# Patient Record
Sex: Female | Born: 2000 | Race: Black or African American | Hispanic: No | Marital: Single | Smoking: Current every day smoker
Health system: Southern US, Community
[De-identification: ages and names within clinical notes are randomized; demographics above are authoritative.]

## PROBLEM LIST (undated history)

## (undated) DIAGNOSIS — D571 Sickle-cell disease without crisis: Secondary | ICD-10-CM

---

## 2019-10-24 ENCOUNTER — Other Ambulatory Visit: Payer: Self-pay

## 2019-10-24 ENCOUNTER — Emergency Department (HOSPITAL_COMMUNITY)
Admission: EM | Admit: 2019-10-24 | Discharge: 2019-10-25 | Disposition: A | Discharge status: Home / Self Care | Payer: Federal, State, Local not specified - PPO | Attending: Emergency Medicine | Admitting: Emergency Medicine

## 2019-10-24 DIAGNOSIS — D5700 Hb-SS disease with crisis, unspecified: Secondary | ICD-10-CM | POA: Diagnosis present

## 2019-10-24 DIAGNOSIS — Z5321 Procedure and treatment not carried out due to patient leaving prior to being seen by health care provider: Secondary | ICD-10-CM | POA: Diagnosis not present

## 2019-10-24 LAB — COMPREHENSIVE METABOLIC PANEL
ALT: 23 U/L (ref 0–44)
AST: 38 U/L (ref 15–41)
Albumin: 3.9 g/dL (ref 3.5–5.0)
Alkaline Phosphatase: 112 U/L (ref 38–126)
Anion gap: 7 (ref 5–15)
BUN: 5 mg/dL — ABNORMAL LOW (ref 6–20)
CO2: 25 mmol/L (ref 22–32)
Calcium: 9 mg/dL (ref 8.9–10.3)
Chloride: 107 mmol/L (ref 98–111)
Creatinine, Ser: 0.34 mg/dL — ABNORMAL LOW (ref 0.44–1.00)
GFR calc Af Amer: >60 mL/min (ref >60)
GFR calc non Af Amer: >60 mL/min (ref >60)
Glucose, Bld: 92 mg/dL (ref 70–99)
Potassium: 3.8 mmol/L (ref 3.5–5.1)
Sodium: 139 mmol/L (ref 135–145)
Total Bilirubin: 3.2 mg/dL — ABNORMAL HIGH (ref 0.3–1.2)
Total Protein: 6.4 g/dL — ABNORMAL LOW (ref 6.5–8.1)

## 2019-10-24 LAB — CBC WITH DIFFERENTIAL/PLATELET
Abs Immature Granulocytes: 0.09 10*3/uL — ABNORMAL HIGH (ref 0.00–0.07)
Basophils Absolute: 0.1 10*3/uL (ref 0.0–0.1)
Basophils Relative: 1 %
Eosinophils Absolute: 0.6 10*3/uL — ABNORMAL HIGH (ref 0.0–0.5)
Eosinophils Relative: 5 %
HCT: 21 % — ABNORMAL LOW (ref 36.0–46.0)
Hemoglobin: 7.5 g/dL — ABNORMAL LOW (ref 12.0–15.0)
Immature Granulocytes: 1 %
Lymphocytes Relative: 43 %
Lymphs Abs: 5.2 10*3/uL — ABNORMAL HIGH (ref 0.7–4.0)
MCH: 32.2 pg (ref 26.0–34.0)
MCHC: 35.7 g/dL (ref 30.0–36.0)
MCV: 90.1 fL (ref 80.0–100.0)
Monocytes Absolute: 0.9 10*3/uL (ref 0.1–1.0)
Monocytes Relative: 7 %
Neutro Abs: 5.2 10*3/uL (ref 1.7–7.7)
Neutrophils Relative %: 43 %
Platelets: 291 10*3/uL (ref 150–400)
RBC: 2.33 MIL/uL — ABNORMAL LOW (ref 3.87–5.11)
RDW: 22 % — ABNORMAL HIGH (ref 11.5–15.5)
WBC: 12.1 10*3/uL — ABNORMAL HIGH (ref 4.0–10.5)
nRBC: 3.1 % — ABNORMAL HIGH (ref 0.0–0.2)

## 2019-10-24 LAB — RETICULOCYTES
Immature Retic Fract: 31.1 % — ABNORMAL HIGH (ref 2.3–15.9)
RBC.: 2.35 MIL/uL — ABNORMAL LOW (ref 3.87–5.11)
Retic Count, Absolute: 241.6 10*3/uL — ABNORMAL HIGH (ref 19.0–186.0)
Retic Ct Pct: 10.3 % — ABNORMAL HIGH (ref 0.4–3.1)

## 2019-10-24 LAB — I-STAT BETA HCG BLOOD, ED (MC, WL, AP ONLY): I-stat hCG, quantitative: <5 m[IU]/mL (ref <5)

## 2019-10-24 MED ORDER — ONDANSETRON 4 MG PO TBDP
4.0000 mg | ORAL_TABLET | Freq: Once | ORAL | Status: AC
  Administered 2019-10-24: 4 mg via ORAL
  Filled 2019-10-24: qty 1

## 2019-10-24 MED ORDER — HYDROMORPHONE HCL 1 MG/ML IJ SOLN
0.5000 mg | Freq: Once | INTRAMUSCULAR | Status: AC
  Administered 2019-10-24: 0.5 mg via SUBCUTANEOUS
  Filled 2019-10-24: qty 1

## 2019-10-24 NOTE — ED Triage Notes (Signed)
Pt presents to ED POV. Pt c/o sickle cell crisis. Pt reports that she is having pain in lower back and knees. NAD

## 2019-10-25 ENCOUNTER — Telehealth: Payer: Self-pay | Admitting: Nurse Practitioner

## 2019-10-25 MED ORDER — HYDROMORPHONE HCL 1 MG/ML IJ SOLN
0.5000 mg | Freq: Once | INTRAMUSCULAR | Status: AC
  Administered 2019-10-25: 0.5 mg via SUBCUTANEOUS
  Filled 2019-10-25: qty 1

## 2019-10-25 NOTE — ED Notes (Signed)
Called multiple times with no answer.

## 2019-10-25 NOTE — Telephone Encounter (Signed)
Brooke Patterson was in the emergency room and called the on-call service for the patient care center in the emergency room.  Patient was able to speak in clear sentences at midnight in no apparent distress.  Patient requested the anticipated timeframe for her to be seen.  I indicated to the patient that I was unaware of any estimated time frame for additional treatment.  She did then ask if there was an hematologist on-call.  I informed her that there was is no one on call.  Ms. Romo is not even a patient of the patient care center at this time.  She was seen on 10/24/2019 at the emergency room at [facility] where she is a current patient.  She has 2 additional follow-up visits with [facility].

## 2019-10-25 NOTE — ED Notes (Signed)
Called for vitals recheck x1 with no response 

## 2020-04-07 ENCOUNTER — Encounter (HOSPITAL_COMMUNITY): Payer: Self-pay

## 2020-04-07 ENCOUNTER — Other Ambulatory Visit: Payer: Self-pay

## 2020-04-07 DIAGNOSIS — D5700 Hb-SS disease with crisis, unspecified: Secondary | ICD-10-CM | POA: Diagnosis not present

## 2020-04-07 DIAGNOSIS — E876 Hypokalemia: Secondary | ICD-10-CM | POA: Diagnosis not present

## 2020-04-07 DIAGNOSIS — G894 Chronic pain syndrome: Secondary | ICD-10-CM | POA: Diagnosis present

## 2020-04-07 DIAGNOSIS — B9620 Unspecified Escherichia coli [E. coli] as the cause of diseases classified elsewhere: Secondary | ICD-10-CM | POA: Diagnosis present

## 2020-04-07 DIAGNOSIS — N3000 Acute cystitis without hematuria: Secondary | ICD-10-CM | POA: Diagnosis present

## 2020-04-07 DIAGNOSIS — Z885 Allergy status to narcotic agent status: Secondary | ICD-10-CM

## 2020-04-07 DIAGNOSIS — Z20822 Contact with and (suspected) exposure to covid-19: Secondary | ICD-10-CM | POA: Diagnosis present

## 2020-04-07 DIAGNOSIS — Z886 Allergy status to analgesic agent status: Secondary | ICD-10-CM

## 2020-04-07 NOTE — ED Triage Notes (Signed)
Pt to ED by POV from home with c/o sickle cell pain. Pt states the pain is in her back ad arms. States she took 2 oxycodone with no relief. Pain started today around 5pm. Arrives Tearful, A+O, VSS.

## 2020-04-08 ENCOUNTER — Encounter (HOSPITAL_COMMUNITY): Payer: Self-pay | Admitting: Family Medicine

## 2020-04-08 ENCOUNTER — Inpatient Hospital Stay (HOSPITAL_COMMUNITY)
Admission: EM | Admit: 2020-04-08 | Discharge: 2020-04-14 | DRG: 812 | Disposition: A | Discharge status: Home / Self Care | Payer: Federal, State, Local not specified - PPO | Attending: Internal Medicine | Admitting: Internal Medicine

## 2020-04-08 DIAGNOSIS — Z20822 Contact with and (suspected) exposure to covid-19: Secondary | ICD-10-CM | POA: Diagnosis present

## 2020-04-08 DIAGNOSIS — B9620 Unspecified Escherichia coli [E. coli] as the cause of diseases classified elsewhere: Secondary | ICD-10-CM

## 2020-04-08 DIAGNOSIS — N3000 Acute cystitis without hematuria: Secondary | ICD-10-CM | POA: Diagnosis present

## 2020-04-08 DIAGNOSIS — N390 Urinary tract infection, site not specified: Secondary | ICD-10-CM

## 2020-04-08 DIAGNOSIS — G894 Chronic pain syndrome: Secondary | ICD-10-CM | POA: Diagnosis present

## 2020-04-08 DIAGNOSIS — E876 Hypokalemia: Secondary | ICD-10-CM | POA: Diagnosis not present

## 2020-04-08 DIAGNOSIS — D5700 Hb-SS disease with crisis, unspecified: Secondary | ICD-10-CM | POA: Diagnosis present

## 2020-04-08 DIAGNOSIS — Z886 Allergy status to analgesic agent status: Secondary | ICD-10-CM | POA: Diagnosis not present

## 2020-04-08 DIAGNOSIS — Z885 Allergy status to narcotic agent status: Secondary | ICD-10-CM | POA: Diagnosis not present

## 2020-04-08 HISTORY — DX: Sickle-cell disease without crisis: D57.1

## 2020-04-08 LAB — I-STAT BETA HCG BLOOD, ED (MC, WL, AP ONLY): I-stat hCG, quantitative: <5 m[IU]/mL (ref <5)

## 2020-04-08 LAB — URINALYSIS, ROUTINE W REFLEX MICROSCOPIC
Bilirubin Urine: NEGATIVE
Glucose, UA: NEGATIVE mg/dL
Ketones, ur: NEGATIVE mg/dL
Leukocytes,Ua: NEGATIVE
Nitrite: NEGATIVE
Protein, ur: 30 mg/dL — AB
Specific Gravity, Urine: 1.011 (ref 1.005–1.030)
pH: 6 (ref 5.0–8.0)

## 2020-04-08 LAB — COMPREHENSIVE METABOLIC PANEL
ALT: 24 U/L (ref 0–44)
AST: 61 U/L — ABNORMAL HIGH (ref 15–41)
Albumin: 4.7 g/dL (ref 3.5–5.0)
Alkaline Phosphatase: 96 U/L (ref 38–126)
Anion gap: 9 (ref 5–15)
BUN: 7 mg/dL (ref 6–20)
CO2: 22 mmol/L (ref 22–32)
Calcium: 9.4 mg/dL (ref 8.9–10.3)
Chloride: 108 mmol/L (ref 98–111)
Creatinine, Ser: 0.3 mg/dL — ABNORMAL LOW (ref 0.44–1.00)
GFR, Estimated: >60 mL/min (ref >60)
Glucose, Bld: 106 mg/dL — ABNORMAL HIGH (ref 70–99)
Potassium: 3.7 mmol/L (ref 3.5–5.1)
Sodium: 139 mmol/L (ref 135–145)
Total Bilirubin: 3.8 mg/dL — ABNORMAL HIGH (ref 0.3–1.2)
Total Protein: 8.2 g/dL — ABNORMAL HIGH (ref 6.5–8.1)

## 2020-04-08 LAB — CBC WITH DIFFERENTIAL/PLATELET
Abs Immature Granulocytes: 0.16 10*3/uL — ABNORMAL HIGH (ref 0.00–0.07)
Basophils Absolute: 0.1 10*3/uL (ref 0.0–0.1)
Basophils Relative: 0 %
Eosinophils Absolute: 0.1 10*3/uL (ref 0.0–0.5)
Eosinophils Relative: 1 %
HCT: 23.6 % — ABNORMAL LOW (ref 36.0–46.0)
Hemoglobin: 8.8 g/dL — ABNORMAL LOW (ref 12.0–15.0)
Immature Granulocytes: 1 %
Lymphocytes Relative: 39 %
Lymphs Abs: 4.9 10*3/uL — ABNORMAL HIGH (ref 0.7–4.0)
MCH: 36.2 pg — ABNORMAL HIGH (ref 26.0–34.0)
MCHC: 37.3 g/dL — ABNORMAL HIGH (ref 30.0–36.0)
MCV: 97.1 fL (ref 80.0–100.0)
Monocytes Absolute: 0.7 10*3/uL (ref 0.1–1.0)
Monocytes Relative: 6 %
Neutro Abs: 6.6 10*3/uL (ref 1.7–7.7)
Neutrophils Relative %: 53 %
Platelets: 288 10*3/uL (ref 150–400)
RBC: 2.43 MIL/uL — ABNORMAL LOW (ref 3.87–5.11)
RDW: 19.3 % — ABNORMAL HIGH (ref 11.5–15.5)
WBC: 12.5 10*3/uL — ABNORMAL HIGH (ref 4.0–10.5)
nRBC: 2.2 % — ABNORMAL HIGH (ref 0.0–0.2)

## 2020-04-08 LAB — RESP PANEL BY RT-PCR (FLU A&B, COVID) ARPGX2
Influenza A by PCR: NEGATIVE
Influenza B by PCR: NEGATIVE
SARS Coronavirus 2 by RT PCR: NEGATIVE

## 2020-04-08 LAB — RETICULOCYTES
Immature Retic Fract: 31.1 % — ABNORMAL HIGH (ref 2.3–15.9)
RBC.: 2.4 MIL/uL — ABNORMAL LOW (ref 3.87–5.11)
Retic Count, Absolute: 173.3 10*3/uL (ref 19.0–186.0)
Retic Ct Pct: 7.2 % — ABNORMAL HIGH (ref 0.4–3.1)

## 2020-04-08 MED ORDER — ENOXAPARIN SODIUM 40 MG/0.4ML ~~LOC~~ SOLN
40.0000 mg | SUBCUTANEOUS | Status: DC
  Administered 2020-04-08 – 2020-04-11 (×3): 40 mg via SUBCUTANEOUS
  Filled 2020-04-08 (×5): qty 0.4

## 2020-04-08 MED ORDER — ONDANSETRON HCL 4 MG/2ML IJ SOLN: 4.0000 mg | Freq: Four times a day (QID) | INTRAMUSCULAR | Status: DC | PRN

## 2020-04-08 MED ORDER — KETOROLAC TROMETHAMINE 15 MG/ML IJ SOLN
15.0000 mg | INTRAMUSCULAR | Status: AC
  Administered 2020-04-08: 15 mg via INTRAVENOUS
  Filled 2020-04-08: qty 1

## 2020-04-08 MED ORDER — NALOXONE HCL 0.4 MG/ML IJ SOLN: 0.4000 mg | INTRAMUSCULAR | Status: DC | PRN

## 2020-04-08 MED ORDER — SENNOSIDES-DOCUSATE SODIUM 8.6-50 MG PO TABS
1.0000 | ORAL_TABLET | Freq: Two times a day (BID) | ORAL | Status: DC
  Administered 2020-04-08 – 2020-04-13 (×10): 1 via ORAL
  Filled 2020-04-08 (×12): qty 1

## 2020-04-08 MED ORDER — HYDROMORPHONE HCL 1 MG/ML IJ SOLN
1.0000 mg | INTRAMUSCULAR | Status: AC
  Administered 2020-04-08: 1 mg via INTRAVENOUS
  Filled 2020-04-08: qty 1

## 2020-04-08 MED ORDER — HYDROMORPHONE 1 MG/ML IV SOLN: INTRAVENOUS | Status: DC

## 2020-04-08 MED ORDER — ONDANSETRON HCL 4 MG/2ML IJ SOLN
4.0000 mg | Freq: Four times a day (QID) | INTRAMUSCULAR | Status: DC | PRN
  Administered 2020-04-08: 4 mg via INTRAVENOUS
  Filled 2020-04-08: qty 2

## 2020-04-08 MED ORDER — ONDANSETRON HCL 4 MG/2ML IJ SOLN
4.0000 mg | Freq: Four times a day (QID) | INTRAMUSCULAR | Status: DC | PRN
  Administered 2020-04-08 – 2020-04-09 (×2): 4 mg via INTRAVENOUS
  Filled 2020-04-08 (×2): qty 2

## 2020-04-08 MED ORDER — SODIUM CHLORIDE 0.9% FLUSH: 9.0000 mL | INTRAVENOUS | Status: DC | PRN

## 2020-04-08 MED ORDER — SODIUM CHLORIDE 0.45 % IV SOLN: INTRAVENOUS | Status: DC

## 2020-04-08 MED ORDER — HYDROMORPHONE HCL 1 MG/ML IJ SOLN
0.5000 mg | INTRAMUSCULAR | Status: AC
  Administered 2020-04-08: 0.5 mg via INTRAVENOUS
  Filled 2020-04-08: qty 1

## 2020-04-08 MED ORDER — DIPHENHYDRAMINE HCL 25 MG PO CAPS
25.0000 mg | ORAL_CAPSULE | ORAL | Status: DC | PRN
Start: 2020-04-08 — End: 2020-04-08

## 2020-04-08 MED ORDER — SODIUM CHLORIDE 0.9 % IV SOLN
25.0000 mg | INTRAVENOUS | Status: DC | PRN
  Filled 2020-04-08: qty 0.5

## 2020-04-08 MED ORDER — HYDROMORPHONE HCL 1 MG/ML IJ SOLN
1.0000 mg | INTRAMUSCULAR | Status: DC | PRN
  Administered 2020-04-08 (×2): 1 mg via INTRAVENOUS
  Filled 2020-04-08 (×2): qty 1

## 2020-04-08 MED ORDER — POLYETHYLENE GLYCOL 3350 17 G PO PACK: 17.0000 g | PACK | Freq: Every day | ORAL | Status: DC | PRN

## 2020-04-08 MED ORDER — HYDROMORPHONE HCL 1 MG/ML IJ SOLN
1.0000 mg | Freq: Once | INTRAMUSCULAR | Status: AC
  Administered 2020-04-08: 1 mg via INTRAVENOUS
  Filled 2020-04-08: qty 1

## 2020-04-08 MED ORDER — HYDROMORPHONE 1 MG/ML IV SOLN
INTRAVENOUS | Status: DC
  Administered 2020-04-08: 30 mg via INTRAVENOUS
  Administered 2020-04-08: 0.9 mg via INTRAVENOUS
  Administered 2020-04-09: 2.1 mg via INTRAVENOUS
  Administered 2020-04-09: 2.7 mg via INTRAVENOUS
  Administered 2020-04-09: 2.5 mg via INTRAVENOUS
  Administered 2020-04-09: 1.2 mg via INTRAVENOUS
  Administered 2020-04-10: 1.8 mg via INTRAVENOUS
  Administered 2020-04-10: 0.1 mg via INTRAVENOUS
  Filled 2020-04-08 (×2): qty 30

## 2020-04-08 MED ORDER — KETOROLAC TROMETHAMINE 15 MG/ML IJ SOLN
15.0000 mg | Freq: Four times a day (QID) | INTRAMUSCULAR | Status: DC
  Administered 2020-04-08 – 2020-04-12 (×19): 15 mg via INTRAVENOUS
  Filled 2020-04-08 (×19): qty 1

## 2020-04-08 MED ORDER — ONDANSETRON 4 MG PO TBDP
4.0000 mg | ORAL_TABLET | Freq: Once | ORAL | Status: AC
  Administered 2020-04-08: 4 mg via ORAL
  Filled 2020-04-08: qty 1

## 2020-04-08 MED ORDER — SODIUM CHLORIDE 0.45 % IV SOLN: INTRAVENOUS | Status: AC

## 2020-04-08 MED ORDER — DIPHENHYDRAMINE HCL 12.5 MG/5ML PO ELIX: 12.5000 mg | ORAL_SOLUTION | Freq: Four times a day (QID) | ORAL | Status: DC | PRN

## 2020-04-08 MED ORDER — OXYCODONE HCL 5 MG PO TABS
10.0000 mg | ORAL_TABLET | Freq: Once | ORAL | Status: DC
Start: 2020-04-08 — End: 2020-04-09
  Filled 2020-04-08: qty 2

## 2020-04-08 NOTE — H&P (Signed)
History and Physical    Brooke Patterson JSH:[id number] DOB: Dec 04, 2000 DOA: 04/08/2020  PCP: Willey Blade, MD   Patient coming from: Home   Chief Complaint: Severe arm pain   HPI: Brooke Patterson is a 20 y.o. female with medical history significant for sickle cell anemia, now presenting to the emergency department for evaluation of severe pain involving bilateral arms.  Patient reports that she was in her usual state until yesterday when she developed some pain in the low back and bilateral arms.  The arm pain became severe and continued to worsen despite using oxycodone at home.  She denies any recent fevers, chills, cough, shortness of breath, or chest pain.  ED Course: Upon arrival to the ED, patient is found to be afebrile, saturating mid 90s on room air, and with stable blood pressure.  Chemistry panel with slight elevation in AST and elevated bilirubin similar to prior.  Chemistry panel features a leukocytosis to 12,500 and normocytic anemia with hemoglobin 8.8.  Patient was treated with IV fluids and multiple doses of IV Dilaudid, reports some improvement in her symptoms, but continues to have severe pain.  Review of Systems:  All other systems reviewed and apart from HPI, are negative.  Past Medical History:  Diagnosis Date  . Sickle cell anemia (HCC)     History reviewed. No pertinent surgical history.  Social History:   reports that she has never smoked. She has never used smokeless tobacco. She reports previous alcohol use. She reports current drug use. Drug: Marijuana.  Allergies  Allergen Reactions  . Morphine And Related     nausea  . Tylenol [Acetaminophen]     nausea    No family history on file.   Prior to Admission medications   Medication Sig Start Date End Date Taking? Authorizing Provider  hydroxyurea (HYDREA) 500 MG capsule Take 3 capsules by mouth daily at 6 (six) AM. 03/10/20  Yes [provider]  ibuprofen (ADVIL) 200 MG tablet Take 600 mg by  mouth every 6 (six) hours as needed for mild pain or moderate pain.   Yes [provider]  oxyCODONE (OXY IR/ROXICODONE) 5 MG immediate release tablet Take 5 mg by mouth 4 (four) times daily as needed. 11/26/19  Yes [provider]    Physical Exam: Vitals:   04/08/20 0122 04/08/20 0259 04/08/20 0300 04/08/20 0432  BP: (!) 145/107 (!) 158/98 (!) 158/98 (!) 151/103  Pulse: 76 82 84 (!) 105  Resp: 16 13 14 13   Temp:      TempSrc:      SpO2: 96% 95% 96% 94%  Weight:      Height:        Constitutional: NAD, calm  Eyes: PERTLA, lids and conjunctivae normal ENMT: Mucous membranes are moist. Posterior pharynx clear of any exudate or lesions.   Neck: normal, supple, no masses, no thyromegaly Respiratory: no wheezing, no crackles. No accessory muscle use.  Cardiovascular: S1 & S2 heard, regular rate and rhythm. No extremity edema.  Abdomen: No distension, no tenderness, soft. Bowel sounds active.  Musculoskeletal: no clubbing / cyanosis. No joint deformity upper and lower extremities.   Skin: no significant rashes, lesions, ulcers. Warm, dry, well-perfused. Neurologic: CN 2-12 grossly intact. Sensation intact. Moving all extremities.  Psychiatric: Alert and oriented to person, place, and situation. Very pleasant and cooperative.    Labs and Imaging on Admission: I have personally reviewed following labs and imaging studies  CBC: Recent Labs  Lab 04/07/20 2348  WBC 12.5*  NEUTROABS 6.6  HGB 8.8*  HCT 23.6*  MCV 97.1  PLT 288   Basic Metabolic Panel: Recent Labs  Lab 04/07/20 2348  NA 139  K 3.7  CL 108  CO2 22  GLUCOSE 106*  BUN 7  CREATININE 0.30*  CALCIUM 9.4   GFR: Estimated Creatinine Clearance: 104.1 mL/min (A) (by C-G formula based on SCr of 0.3 mg/dL (L)). Liver Function Tests: Recent Labs  Lab 04/07/20 2348  AST 61*  ALT 24  ALKPHOS 96  BILITOT 3.8*  PROT 8.2*  ALBUMIN 4.7   No results for input(s): LIPASE, AMYLASE in the last 168  hours. No results for input(s): AMMONIA in the last 168 hours. Coagulation Profile: No results for input(s): INR, PROTIME in the last 168 hours. Cardiac Enzymes: No results for input(s): CKTOTAL, CKMB, CKMBINDEX, TROPONINI in the last 168 hours. BNP (last 3 results) No results for input(s): PROBNP in the last 8760 hours. HbA1C: No results for input(s): HGBA1C in the last 72 hours. CBG: No results for input(s): GLUCAP in the last 168 hours. Lipid Profile: No results for input(s): CHOL, HDL, LDLCALC, TRIG, CHOLHDL, LDLDIRECT in the last 72 hours. Thyroid Function Tests: No results for input(s): TSH, T4TOTAL, FREET4, T3FREE, THYROIDAB in the last 72 hours. Anemia Panel: Recent Labs    04/07/20 2348  RETICCTPCT 7.2*   Urine analysis:    Component Value Date/Time   COLORURINE YELLOW 04/08/2020 0011   APPEARANCEUR CLEAR 04/08/2020 0011   LABSPEC 1.011 04/08/2020 0011   PHURINE 6.0 04/08/2020 0011   GLUCOSEU NEGATIVE 04/08/2020 0011   HGBUR MODERATE (A) 04/08/2020 0011   BILIRUBINUR NEGATIVE 04/08/2020 0011   KETONESUR NEGATIVE 04/08/2020 0011   PROTEINUR 30 (A) 04/08/2020 0011   NITRITE NEGATIVE 04/08/2020 0011   LEUKOCYTESUR NEGATIVE 04/08/2020 0011   Sepsis Labs: @LABRCNTIP (procalcitonin:4,lacticidven:4) )No results found for this or any previous visit (from the past 240 hour(s)).   Radiological Exams on Admission: No results found.  Assessment/Plan   1. Sickle cell anemia with pain crisis  - Presents with 1 day of severe bilateral arm pain not relieved by oxycodone at home  - Hgb stable, WBC elevated without infectious s/s  - She reports improvement after IVF and multiple doses IV Dilaudid in ED but continues to have severe pain  - Continue IVF hydration, schedule Toradol, start weight-based Dilaudid PCA     DVT prophylaxis: Lovenox  Code Status: Full  Level of Care: Level of care: Med-Surg Family Communication: none present  Disposition Plan:  Patient is from:  Home  Anticipated d/c is to: Home  Anticipated d/c date is: 04/11/20 Patient currently: Pending pain-control  Consults called: None  Admission status: Inpatient     04/13/20, MD [facility]  04/08/2020, 7:18 AM

## 2020-04-08 NOTE — Progress Notes (Signed)
°  Brooke Patterson is a 20 year old female with a medical history significant for sickle cell disease that was admitted overnight and sickle cell pain crisis.  Patient continues to have increased pain primarily to bilateral upper extremities.  Pain intensity is 10/10 characterized as constant and throbbing.  Patient denies any headache, chest pain, shortness of breath, dizziness, urinary symptoms, nausea, vomiting, or diarrhea.  Patient is very tearful and states that she has not been able to get any relief despite IV Dilaudid.  Care plan: Patient is opiate nave.  Discontinue custom dose PCA and transition to full dose.  Will reassess in AM.  Nolon Nations  APRN, MSN, FNP-C Patient Care [facility] [address] [place], [place] [address] [phone]

## 2020-04-08 NOTE — ED Provider Notes (Signed)
[facility]-EMERGENCY DEPT Provider Note   CSN: [id number] Arrival date & time: 04/07/20  2300     History Chief Complaint  Patient presents with  . Sickle Cell Pain Crisis    Brooke Patterson is a 20 y.o. female.  Patient presents to the emergency department with complaints of sickle cell crisis.  Patient reports that she started having pain in her lower back and both of her arms around 5 PM tonight.  She took oxycodone which he uses as needed for her pain and it did not help.  She has not had a painful crisis since last summer.  Patient has not had any fever, chest pain, shortness of breath.  She denies urinary symptoms.        Past Medical History:  Diagnosis Date  . Sickle cell anemia [facility])     Patient Active Problem List   Diagnosis Date Noted  . Sickle cell pain crisis (HCC) 04/08/2020    History reviewed. No pertinent surgical history.   OB History   No obstetric history on file.     No family history on file.  Social History   Tobacco Use  . Smoking status: Never Smoker  . Smokeless tobacco: Never Used  Substance Use Topics  . Alcohol use: Not Currently  . Drug use: Yes    Types: Marijuana    Home Medications Prior to Admission medications   Medication Sig Start Date End Date Taking? Authorizing Provider  hydroxyurea (HYDREA) 500 MG capsule Take 3 capsules by mouth daily at 6 (six) AM. 03/10/20  Yes [provider]  ibuprofen (ADVIL) 200 MG tablet Take 600 mg by mouth every 6 (six) hours as needed for mild pain or moderate pain.   Yes [provider]  oxyCODONE (OXY IR/ROXICODONE) 5 MG immediate release tablet Take 5 mg by mouth 4 (four) times daily as needed. 11/26/19  Yes [provider]    Allergies    Morphine and related and Tylenol [acetaminophen]  Review of Systems   Review of Systems  Musculoskeletal: Positive for back pain.  All other systems reviewed and are negative.   Physical  Exam Updated Vital Signs BP (!) 151/103   Pulse (!) 105   Temp 98 F (36.7 C) (Oral)   Resp 13   Ht 5\' 6"  (1.676 m)   Wt 61.2 kg   LMP 04/02/2020 (Approximate)   SpO2 94%   BMI 21.79 kg/m   Physical Exam Vitals and nursing note reviewed.  Constitutional:      General: She is in acute distress.     Appearance: Normal appearance. She is well-developed.  HENT:     Head: Normocephalic and atraumatic.     Right Ear: Hearing normal.     Left Ear: Hearing normal.     Nose: Nose normal.  Eyes:     Conjunctiva/sclera: Conjunctivae normal.     Pupils: Pupils are equal, round, and reactive to light.  Cardiovascular:     Rate and Rhythm: Regular rhythm.     Heart sounds: S1 normal and S2 normal. No murmur heard. No friction rub. No gallop.   Pulmonary:     Effort: Pulmonary effort is normal. No respiratory distress.     Breath sounds: Normal breath sounds.  Chest:     Chest wall: No tenderness.  Abdominal:     General: Bowel sounds are normal.     Palpations: Abdomen is soft.     Tenderness: There is no abdominal tenderness. There  is no guarding or rebound. Negative signs include Murphy's sign and McBurney's sign.     Hernia: No hernia is present.  Musculoskeletal:        General: Normal range of motion.     Cervical back: Normal range of motion and neck supple.  Skin:    General: Skin is warm and dry.     Findings: No rash.  Neurological:     Mental Status: She is alert and oriented to person, place, and time.     GCS: GCS eye subscore is 4. GCS verbal subscore is 5. GCS motor subscore is 6.     Cranial Nerves: No cranial nerve deficit.     Sensory: No sensory deficit.     Coordination: Coordination normal.  Psychiatric:        Speech: Speech normal.        Behavior: Behavior normal.        Thought Content: Thought content normal.     ED Results / Procedures / Treatments   Labs (all labs ordered are listed, but only abnormal results are displayed) Labs Reviewed   COMPREHENSIVE METABOLIC PANEL - Abnormal; Notable for the following components:      Result Value   Glucose, Bld 106 (*)    Creatinine, Ser 0.30 (*)    Total Protein 8.2 (*)    AST 61 (*)    Total Bilirubin 3.8 (*)    All other components within normal limits  CBC WITH DIFFERENTIAL/PLATELET - Abnormal; Notable for the following components:   WBC 12.5 (*)    RBC 2.43 (*)    Hemoglobin 8.8 (*)    HCT 23.6 (*)    MCH 36.2 (*)    MCHC 37.3 (*)    RDW 19.3 (*)    nRBC 2.2 (*)    Lymphs Abs 4.9 (*)    Abs Immature Granulocytes 0.16 (*)    All other components within normal limits  RETICULOCYTES - Abnormal; Notable for the following components:   Retic Ct Pct 7.2 (*)    RBC. 2.40 (*)    Immature Retic Fract 31.1 (*)    All other components within normal limits  URINALYSIS, ROUTINE W REFLEX MICROSCOPIC - Abnormal; Notable for the following components:   Hgb urine dipstick MODERATE (*)    Protein, ur 30 (*)    Bacteria, UA RARE (*)    All other components within normal limits  RESP PANEL BY RT-PCR (FLU A&B, COVID) ARPGX2  I-STAT BETA HCG BLOOD, ED ([facility], [facility], [facility] ONLY)    EKG None  Radiology No results found.  Procedures Procedures   Medications Ordered in ED Medications  HYDROmorphone (DILAUDID) injection 1 mg (1 mg Intravenous Given 04/08/20 0604)  0.45 % sodium chloride infusion ( Intravenous New Bag/Given 04/08/20 0634)  senna-docusate (Senokot-S) tablet 1 tablet (has no administration in time range)  polyethylene glycol (MIRALAX / GLYCOLAX) packet 17 g (has no administration in time range)  naloxone (NARCAN) injection 0.4 mg (has no administration in time range)    And  sodium chloride flush (NS) 0.9 % injection 9 mL (has no administration in time range)  ondansetron (ZOFRAN) injection 4 mg (has no administration in time range)  diphenhydrAMINE (BENADRYL) capsule 25 mg (has no administration in time range)    Or  diphenhydrAMINE (BENADRYL) 25 mg in sodium chloride 0.9 %  50 mL IVPB (has no administration in time range)  enoxaparin (LOVENOX) injection 40 mg (has no administration in time range)  ketorolac (TORADOL)  15 MG/ML injection 15 mg (15 mg Intravenous Given 04/08/20 0643)  HYDROmorphone (DILAUDID) 1 mg/mL PCA injection (has no administration in time range)  ketorolac (TORADOL) 15 MG/ML injection 15 mg (15 mg Intravenous Given 04/08/20 0042)  HYDROmorphone (DILAUDID) injection 0.5 mg (0.5 mg Intravenous Given 04/08/20 0042)  HYDROmorphone (DILAUDID) injection 1 mg (1 mg Intravenous Given 04/08/20 0122)  HYDROmorphone (DILAUDID) injection 1 mg (1 mg Intravenous Given 04/08/20 0245)    ED Course  I have reviewed the triage vital signs and the nursing notes.  Pertinent labs & imaging results that were available during my care of the patient were reviewed by me and considered in my medical decision making (see chart for details).    MDM Rules/Calculators/A&P                          Patient presented to the emergency department for evaluation of pain in her arms and across the lower back.  Patient has a history of sickle cell disease.  She presents with sickle cell crisis.  No chest pain, no difficulty breathing, no fever.  No concern for acute chest syndrome.  Lab work unremarkable, no significant anemia.  Patient given incremental doses of Dilaudid.  She reports improvement but is still experiencing moderate pain, will require hospitalization for further management of uncomplicated sickle cell crisis.  Final Clinical Impression(s) / ED Diagnoses Final diagnoses:  Sickle cell pain crisis [facility])    Rx / DC Orders ED Discharge Orders    None       Saranya Harlin, Canary Brim, MD 04/08/20 [phone]

## 2020-04-09 DIAGNOSIS — D5700 Hb-SS disease with crisis, unspecified: Secondary | ICD-10-CM | POA: Diagnosis not present

## 2020-04-09 LAB — URINALYSIS, ROUTINE W REFLEX MICROSCOPIC
Bilirubin Urine: NEGATIVE
Glucose, UA: NEGATIVE mg/dL
Ketones, ur: 80 mg/dL — AB
Nitrite: NEGATIVE
Protein, ur: 30 mg/dL — AB
Specific Gravity, Urine: 1.009 (ref 1.005–1.030)
WBC, UA: >50 WBC/hpf — ABNORMAL HIGH (ref 0–5)
pH: 6 (ref 5.0–8.0)

## 2020-04-09 LAB — CBC
HCT: 23.9 % — ABNORMAL LOW (ref 36.0–46.0)
Hemoglobin: 8.8 g/dL — ABNORMAL LOW (ref 12.0–15.0)
MCH: 35.9 pg — ABNORMAL HIGH (ref 26.0–34.0)
MCHC: 36.8 g/dL — ABNORMAL HIGH (ref 30.0–36.0)
MCV: 97.6 fL (ref 80.0–100.0)
Platelets: 260 10*3/uL (ref 150–400)
RBC: 2.45 MIL/uL — ABNORMAL LOW (ref 3.87–5.11)
RDW: 21 % — ABNORMAL HIGH (ref 11.5–15.5)
WBC: 9.1 10*3/uL (ref 4.0–10.5)
nRBC: 5.1 % — ABNORMAL HIGH (ref 0.0–0.2)

## 2020-04-09 LAB — HIV ANTIBODY (ROUTINE TESTING W REFLEX): HIV Screen 4th Generation wRfx: NONREACTIVE

## 2020-04-09 MED ORDER — OXYCODONE HCL 5 MG PO TABS
10.0000 mg | ORAL_TABLET | ORAL | Status: DC | PRN
Start: 2020-04-09 — End: 2020-04-14
  Administered 2020-04-09 – 2020-04-14 (×19): 10 mg via ORAL
  Filled 2020-04-09 (×22): qty 2

## 2020-04-09 MED ORDER — SODIUM CHLORIDE 0.45 % IV SOLN: INTRAVENOUS | Status: AC

## 2020-04-09 MED ORDER — SODIUM CHLORIDE 0.9 % IV SOLN
1.0000 g | Freq: Every day | INTRAVENOUS | Status: DC
  Administered 2020-04-09 – 2020-04-11 (×3): 1 g via INTRAVENOUS
  Filled 2020-04-09: qty 1
  Filled 2020-04-09: qty 0.05
  Filled 2020-04-09: qty 1

## 2020-04-09 NOTE — Progress Notes (Signed)
Cross-coverage note:   Patient complaining of dysuria. UA consistent with UTI. Culture sent. She has leukocytosis but no other SIRS criteria. Plan to start Rocephin.

## 2020-04-09 NOTE — Progress Notes (Signed)
Subjective: Brooke Patterson is a 20 year old female with a medical history significant for sickle cell disease that was admitted for sickle cell pain crisis. Patient is complaining of dysuria and urinary frequency today.  Overnight, urinalysis showed increased leukocytes and bacteria, IV ceftriaxone was initiated.  Patient also continues to complain of pain primarily to upper extremities.  She denies any headache, chest pain, dizziness, nausea, vomiting, or diarrhea.  Objective:  Vital signs in last 24 hours:  Vitals:   04/09/20 0447 04/09/20 0734 04/09/20 1102 04/09/20 1135  BP: (!) 125/91  (!) 144/98   Pulse: 82  66   Resp: 16 16 18 14   Temp: 98.2 F (36.8 C)  98.5 F (36.9 C)   TempSrc: Oral  Oral   SpO2: 100% 98% 94% 96%  Weight:      Height:        Intake/Output from previous day:  No intake or output data in the 24 hours ending 04/09/20 1148  Physical Exam: General: Alert, awake, oriented x3, in no acute distress.  HEENT: [place]/AT PEERL, EOMI Neck: Trachea midline,  no masses, no thyromegal,y no JVD, no carotid bruit OROPHARYNX:  Moist, No exudate/ erythema/lesions.  Heart: Regular rate and rhythm, without murmurs, rubs, gallops, PMI non-displaced, no heaves or thrills on palpation.  Lungs: Clear to auscultation, no wheezing or rhonchi noted. No increased vocal fremitus resonant to percussion  Abdomen: Soft, nontender, nondistended, positive bowel sounds, no masses no hepatosplenomegaly noted..  Neuro: No focal neurological deficits noted cranial nerves II through XII grossly intact. DTRs 2+ bilaterally upper and lower extremities. Strength 5 out of 5 in bilateral upper and lower extremities. Musculoskeletal: No warm swelling or erythema around joints, no spinal tenderness noted. Psychiatric: Patient alert and oriented x3, good insight and cognition, good recent to remote recall. Lymph node survey: No cervical axillary or inguinal lymphadenopathy noted.  Lab Results:  Basic  Metabolic Panel:    Component Value Date/Time   NA 139 04/07/2020 2348   K 3.7 04/07/2020 2348   CL 108 04/07/2020 2348   CO2 22 04/07/2020 2348   BUN 7 04/07/2020 2348   CREATININE 0.30 (L) 04/07/2020 2348   GLUCOSE 106 (H) 04/07/2020 2348   CALCIUM 9.4 04/07/2020 2348   CBC:    Component Value Date/Time   WBC 9.1 04/09/2020 0529   HGB 8.8 (L) 04/09/2020 0529   HCT 23.9 (L) 04/09/2020 0529   PLT 260 04/09/2020 0529   MCV 97.6 04/09/2020 0529   NEUTROABS 6.6 04/07/2020 2348   LYMPHSABS 4.9 (H) 04/07/2020 2348   MONOABS 0.7 04/07/2020 2348   EOSABS 0.1 04/07/2020 2348   BASOSABS 0.1 04/07/2020 2348    Recent Results (from the past 240 hour(s))  Resp Panel by RT-PCR (Flu A&B, Covid) Nasopharyngeal Swab     Status: None   Collection Time: 04/08/20  4:35 AM   Specimen: Nasopharyngeal Swab; Nasopharyngeal(NP) swabs in vial transport medium  Result Value Ref Range Status   SARS Coronavirus 2 by RT PCR NEGATIVE NEGATIVE Final    Comment: (NOTE) SARS-CoV-2 target nucleic acids are NOT DETECTED.  The SARS-CoV-2 RNA is generally detectable in upper respiratory specimens during the acute phase of infection. The lowest concentration of SARS-CoV-2 viral copies this assay can detect is 138 copies/mL. A negative result does not preclude SARS-Cov-2 infection and should not be used as the sole basis for treatment or other patient management decisions. A negative result may occur with  improper specimen collection/handling, submission of specimen other than nasopharyngeal  swab, presence of viral mutation(s) within the areas targeted by this assay, and inadequate number of viral copies(<138 copies/mL). A negative result must be combined with clinical observations, patient history, and epidemiological information. The expected result is Negative.  Fact Sheet for Patients:  BloggerCourse.com  Fact Sheet for Healthcare Providers:   SeriousBroker.it  This test is no t yet approved or cleared by the [place] FDA and  has been authorized for detection and/or diagnosis of SARS-CoV-2 by FDA under an Emergency Use Authorization (EUA). This EUA will remain  in effect (meaning this test can be used) for the duration of the COVID-19 declaration under Section 564(b)(1) of the Act, 21 U.S.C.section 360bbb-3(b)(1), unless the authorization is terminated  or revoked sooner.       Influenza A by PCR NEGATIVE NEGATIVE Final   Influenza B by PCR NEGATIVE NEGATIVE Final    Comment: (NOTE) The Xpert Xpress SARS-CoV-2/FLU/RSV plus assay is intended as an aid in the diagnosis of influenza from Nasopharyngeal swab specimens and should not be used as a sole basis for treatment. Nasal washings and aspirates are unacceptable for Xpert Xpress SARS-CoV-2/FLU/RSV testing.  Fact Sheet for Patients: BloggerCourse.com  Fact Sheet for Healthcare Providers: SeriousBroker.it  This test is not yet approved or cleared by the [place] FDA and has been authorized for detection and/or diagnosis of SARS-CoV-2 by FDA under an Emergency Use Authorization (EUA). This EUA will remain in effect (meaning this test can be used) for the duration of the COVID-19 declaration under Section 564(b)(1) of the Act, 21 U.S.C. section 360bbb-3(b)(1), unless the authorization is terminated or revoked.  Performed at [facility], [address] [place], [place] [address]     Studies/Results: No results found.  Medications: Scheduled Meds: . enoxaparin (LOVENOX) injection  40 mg Subcutaneous Q24H  . HYDROmorphone   Intravenous Q4H  . ketorolac  15 mg Intravenous Q6H  . senna-docusate  1 tablet Oral BID   Continuous Infusions: . sodium chloride    . cefTRIAXone (ROCEPHIN)  IV 1 g (04/09/20 0738)   PRN Meds:.diphenhydrAMINE, naloxone **AND**  sodium chloride flush, ondansetron (ZOFRAN) IV, oxyCODONE, polyethylene glycol  Consultants:  None  Procedures:  None  Antibiotics:  IV ceftriaxone  Assessment/Plan: Active Problems:   Sickle cell pain crisis (HCC)  Sickle cell disease with pain crisis: Continue IV fluids, 0.45% saline at 100 mL/h Continue full dose PCA, no changes in settings Toradol 15 mg IV every 6 hours for total of 5 days Monitor vital signs very closely, reevaluate pain scale regularly, and supplemental oxygen as needed.  Urinary tract infection: Patient complaining of dysuria.  Urinalysis shows large leukocytes and large bacteria.  IV antibiotics initiated.  Follow urine culture.    Sickle cell anemia: Hemoglobin 8.8, consistent with patient's baseline.  There is no clinical indication for blood transfusion at today.  Leukocytosis: Resolved.  Continue to follow closely.  Continue antibiotics.  Follow labs closely.  Code Status: Full Code Family Communication: N/A Disposition Plan: Not yet ready for discharge   Rennis Petty  APRN, MSN, FNP-C Patient Care Center [facility] [address] [place], [place] [address] [phone] If 5PM-8AM, please contact night-coverage.  04/09/2020, 11:48 AM  LOS: 1 day

## 2020-04-10 ENCOUNTER — Encounter: Payer: Self-pay | Admitting: Family Medicine

## 2020-04-10 LAB — CBC
HCT: 22.9 % — ABNORMAL LOW (ref 36.0–46.0)
Hemoglobin: 8.1 g/dL — ABNORMAL LOW (ref 12.0–15.0)
MCH: 35.7 pg — ABNORMAL HIGH (ref 26.0–34.0)
MCHC: 35.4 g/dL (ref 30.0–36.0)
MCV: 100.9 fL — ABNORMAL HIGH (ref 80.0–100.0)
Platelets: 221 10*3/uL (ref 150–400)
RBC: 2.27 MIL/uL — ABNORMAL LOW (ref 3.87–5.11)
RDW: 21.2 % — ABNORMAL HIGH (ref 11.5–15.5)
WBC: 9.7 10*3/uL (ref 4.0–10.5)
nRBC: 4 % — ABNORMAL HIGH (ref 0.0–0.2)

## 2020-04-10 MED ORDER — HYDROMORPHONE HCL 1 MG/ML IJ SOLN
1.0000 mg | INTRAMUSCULAR | Status: DC | PRN
Start: 2020-04-10 — End: 2020-04-11
  Administered 2020-04-11: 1 mg via INTRAVENOUS
  Filled 2020-04-10: qty 1

## 2020-04-11 ENCOUNTER — Encounter: Payer: Self-pay | Admitting: Family Medicine

## 2020-04-11 DIAGNOSIS — D5700 Hb-SS disease with crisis, unspecified: Secondary | ICD-10-CM | POA: Diagnosis not present

## 2020-04-11 DIAGNOSIS — B9620 Unspecified Escherichia coli [E. coli] as the cause of diseases classified elsewhere: Secondary | ICD-10-CM

## 2020-04-11 DIAGNOSIS — N3000 Acute cystitis without hematuria: Secondary | ICD-10-CM | POA: Diagnosis not present

## 2020-04-11 DIAGNOSIS — N390 Urinary tract infection, site not specified: Secondary | ICD-10-CM

## 2020-04-11 LAB — URINE CULTURE: Culture: >=100000 — AB

## 2020-04-11 MED ORDER — CEPHALEXIN 500 MG PO CAPS
500.0000 mg | ORAL_CAPSULE | Freq: Four times a day (QID) | ORAL | Status: DC
  Administered 2020-04-11 – 2020-04-14 (×13): 500 mg via ORAL
  Filled 2020-04-11 (×13): qty 1

## 2020-04-11 MED ORDER — SODIUM CHLORIDE 0.45 % IV SOLN: INTRAVENOUS | Status: DC

## 2020-04-11 MED ORDER — OXYCODONE HCL 10 MG PO TABS: 5.0000 mg | ORAL_TABLET | Freq: Four times a day (QID) | ORAL | 0 refills | Status: DC | PRN

## 2020-04-11 MED ORDER — HYDROMORPHONE HCL 2 MG/ML IJ SOLN
2.0000 mg | INTRAMUSCULAR | Status: DC | PRN
  Administered 2020-04-11 – 2020-04-12 (×2): 2 mg via INTRAVENOUS
  Filled 2020-04-11 (×2): qty 1

## 2020-04-11 MED ORDER — HYDROMORPHONE HCL 2 MG/ML IJ SOLN
2.0000 mg | Freq: Once | INTRAMUSCULAR | Status: AC
  Administered 2020-04-11: 2 mg via INTRAVENOUS
  Filled 2020-04-11: qty 1

## 2020-04-11 MED ORDER — IBUPROFEN 800 MG PO TABS: 800.0000 mg | ORAL_TABLET | Freq: Three times a day (TID) | ORAL | 0 refills | Status: AC | PRN

## 2020-04-11 MED ORDER — FOLIC ACID 1 MG PO TABS: 1.0000 mg | ORAL_TABLET | Freq: Every day | ORAL | 11 refills | Status: AC

## 2020-04-11 MED ORDER — CEPHALEXIN 500 MG PO CAPS: 500.0000 mg | ORAL_CAPSULE | Freq: Four times a day (QID) | ORAL | 0 refills | Status: AC

## 2020-04-11 NOTE — Discharge Instructions (Signed)
Sickle Cell Anemia, Adult  Sickle cell anemia is a condition where your red blood cells are shaped like sickles. Red blood cells carry oxygen through the body. Sickle-shaped cells do not live as long as normal red blood cells. They also clump together and block blood from flowing through the blood vessels. This prevents the body from getting enough oxygen. Sickle cell anemia causes organ damage and pain. It also increases the risk of infection. Follow these instructions at home: Medicines  Take over-the-counter and prescription medicines only as told by your doctor.  If you were prescribed an antibiotic medicine, take it as told by your doctor. Do not stop taking the antibiotic even if you start to feel better.  If you develop a fever, do not take medicines to lower the fever right away. Tell your doctor about the fever. Managing pain, stiffness, and swelling  Try these methods to help with pain: ? Use a heating pad. ? Take a warm bath. ? Distract yourself, such as by watching TV. Eating and drinking  Drink enough fluid to keep your pee (urine) clear or pale yellow. Drink more in hot weather and during exercise.  Limit or avoid alcohol.  Eat a healthy diet. Eat plenty of fruits, vegetables, whole grains, and lean protein.  Take vitamins and supplements as told by your doctor. Traveling  When traveling, keep these with you: ? Your medical information. ? The names of your doctors. ? Your medicines.  If you need to take an airplane, talk to your doctor first. Activity  Rest often.  Avoid exercises that make your heart beat much faster, such as jogging. General instructions  Do not use products that have nicotine or tobacco, such as cigarettes and e-cigarettes. If you need help quitting, ask your doctor.  Consider wearing a medical alert bracelet.  Avoid being in high places (high altitudes), such as mountains.  Avoid very hot or cold temperatures.  Avoid places where the  temperature changes a lot.  Keep all follow-up visits as told by your doctor. This is important. Contact a doctor if:  A joint hurts.  Your feet or hands hurt or swell.  You feel tired (fatigued). Get help right away if:  You have symptoms of infection. These include: ? Fever. ? Chills. ? Being very tired. ? Irritability. ? Poor eating. ? Throwing up (vomiting).  You feel dizzy or faint.  You have new stomach pain, especially on the left side.  You have a an erection (priapism) that lasts more than 4 hours.  You have numbness in your arms or legs.  You have a hard time moving your arms or legs.  You have trouble talking.  You have pain that does not go away when you take medicine.  You are short of breath.  You are breathing fast.  You have a long-term cough.  You have pain in your chest.  You have a bad headache.  You have a stiff neck.  Your stomach looks bloated even though you did not eat much.  Your skin is pale.  You suddenly cannot see well. Summary  Sickle cell anemia is a condition where your red blood cells are shaped like sickles.  Follow your doctor's advice on ways to manage pain, food to eat, activities to do, and steps to take for safe travel.  Get medical help right away if you have any signs of infection, such as a fever. This information is not intended to replace advice given to you by   your health care provider. Make sure you discuss any questions you have with your health care provider. Document Revised: 06/07/2019 Document Reviewed: 06/07/2019 Elsevier Patient Education  2021 [employer] Inc[employer]  Urinary Tract Infection, Adult A urinary tract infection (UTI) is an infection of any part of the urinary tract. The urinary tract includes:  The kidneys.  The ureters.  The bladder.  The urethra. These organs make, store, and get rid of pee (urine) in the body. What are the causes? This infection is caused by germs (bacteria) in your  genital area. These germs grow and cause swelling (inflammation) of your urinary tract. What increases the risk? The following factors may make you more likely to develop this condition:  Using a small, thin tube (catheter) to drain pee.  Not being able to control when you pee or poop (incontinence).  Being female. If you are female, these things can increase the risk: ? Using these methods to prevent pregnancy:  A medicine that kills sperm (spermicide).  A device that blocks sperm (diaphragm). ? Having low levels of a female hormone (estrogen). ? Being pregnant. You are more likely to develop this condition if:  You have genes that add to your risk.  You are sexually active.  You take antibiotic medicines.  You have trouble peeing because of: ? A prostate that is bigger than normal, if you are female. ? A blockage in the part of your body that drains pee from the bladder. ? A kidney stone. ? A nerve condition that affects your bladder. ? Not getting enough to drink. ? Not peeing often enough.  You have other conditions, such as: ? Diabetes. ? A weak disease-fighting system (immune system). ? Sickle cell disease. ? Gout. ? Injury of the spine. What are the signs or symptoms? Symptoms of this condition include:  Needing to pee right away.  Peeing small amounts often.  Pain or burning when peeing.  Blood in the pee.  Pee that smells bad or not like normal.  Trouble peeing.  Pee that is cloudy.  Fluid coming from the vagina, if you are female.  Pain in the belly or lower back. Other symptoms include:  Vomiting.  Not feeling hungry.  Feeling mixed up (confused). This may be the first symptom in older adults.  Being tired and grouchy (irritable).  A fever.  Watery poop (diarrhea). How is this treated?  Taking antibiotic medicine.  Taking other medicines.  Drinking enough water. In some cases, you may need to see a specialist. Follow these  instructions at home: Medicines  Take over-the-counter and prescription medicines only as told by your doctor.  If you were prescribed an antibiotic medicine, take it as told by your doctor. Do not stop taking it even if you start to feel better. General instructions  Make sure you: ? Pee until your bladder is empty. ? Do not hold pee for a long time. ? Empty your bladder after sex. ? Wipe from front to back after peeing or pooping if you are a female. Use each tissue one time when you wipe.  Drink enough fluid to keep your pee pale yellow.  Keep all follow-up visits.   Contact a doctor if:  You do not get better after 1-2 days.  Your symptoms go away and then come back. Get help right away if:  You have very bad back pain.  You have very bad pain in your lower belly.  You have a fever.  You have chills.  You feeling like you will vomit or you vomit. Summary  A urinary tract infection (UTI) is an infection of any part of the urinary tract.  This condition is caused by germs in your genital area.  There are many risk factors for a UTI.  Treatment includes antibiotic medicines.  Drink enough fluid to keep your pee pale yellow. This information is not intended to replace advice given to you by your health care provider. Make sure you discuss any questions you have with your health care provider. Document Revised: 08/24/2019 Document Reviewed: 08/24/2019 Elsevier Patient Education  2021 [employer].

## 2020-04-11 NOTE — Progress Notes (Signed)
BP 160/100 pt /co pain MD notified, dialudid IV x1 ordered, discharge reviewed with patient

## 2020-04-11 NOTE — Discharge Summary (Signed)
Physician Discharge Summary  Meilah Delrosario TIR:[id number] DOB: Nov 25, 2000 DOA: 04/08/2020  PCP: Willey Blade, MD  Admit date: 04/08/2020  Discharge date: 04/11/2020  Discharge Diagnoses:  Active Problems:   Sickle cell pain crisis [facility])   Discharge Condition: Stable  Disposition:   Follow-up Information    Shivadas, Synetta Fail, MD Follow up in 2 week(s).   Specialty: Family Medicine Contact information: [address] [place] [address] [phone]              Pt is discharged home in good condition and is to follow up with Willey Blade, MD this week to have labs evaluated. Evelin Cake is instructed to increase activity slowly and balance with rest for the next few days, and use prescribed medication to complete treatment of pain  Diet: Regular Wt Readings from Last 3 Encounters:  04/08/20 63.6 kg    History of present illness:  Laquinda Moller is a 19 year old female with a medical history significant for sickle cell disease presented to the emergency department for evaluation of severe pain involving bilateral arms.  Patient was in usual state of health until 1 day prior when she developed some pain in the low back and bilateral arms.  The arm pain became severe and continued to worsen despite using oxycodone at home.  Patient denies any subjective fever, chills, cough, shortness of breath, or chest pain.  ER course: Upon arrival to the ER, patient was found to be afebrile, oxygen saturation mid 90s on room air, and with stable blood pressure.  Chemistry panel with slight elevation in AST and elevated bilirubin similar to prior.  Chemistry panel features of a leukocytosis to 12,500 and normocytic anemia with a hemoglobin of 8.8.  Patient was treated with IV fluids and multiple doses of IV antibiotics.  Patient reported minimal improvement in symptoms but continued to have severe pain.  Patient admitted to MedSurg for further management of sickle cell pain  crisis.  Hospital Course:  Sickle cell disease with pain crisis: Patient was admitted for sickle cell pain crisis and managed appropriately with IVF, IV Dilaudid via PCA and IV Toradol, as well as other adjunct therapies per sickle cell pain management protocols.  IV Dilaudid PCA was weaned appropriately.  Patient transition to oxycodone.  To complete treatment oxycodone 10 mg #20 was sent to patient's pharmacy.  PDMP reviewed prior to prescribing opiate medications, no inconsistencies noted.  Patient has infrequent pain crisis.  She has been lost to follow-up with hematology.  Advised to reestablish care. Discussed restarting disease modifying agents. Patient will also complete treatment with ibuprofen 800 mg every 8 hours as needed for mild to moderate pain. Advised to hydrate with 64 ounces of fluid daily  Acute cystitis without hematuria: Patient experienced urinary symptoms including dysuria.  Urinalysis showed increased bacteria and large leukocytes.  Urine culture yielded E. coli 100,000 colonies.  IV ceftriaxone initiated.  Patient will complete treatment with Keflex 500 mg every 6 hours for an additional 5 days.  Patient advised to wiping front to back, practice perianal hygiene, and continue hydration  Patient was therefore discharged home today in a hemodynamically stable condition.   Kenslie will follow-up with PCP within 1 week of this discharge. Maddalena was counseled extensively about nonpharmacologic means of pain management, patient verbalized understanding and was appreciative of  the care received during this admission.   We discussed the need for good hydration, monitoring of hydration status, avoidance of heat, cold, stress, and infection triggers.  Continue folic acid 1 mg daily.  Patient was reminded of the need to seek medical attention immediately if any symptom of bleeding, anemia, or infection occurs.  Discharge Exam: Vitals:   04/11/20 0139 04/11/20 0433  BP: (!)  138/95 (!) 155/108  Pulse: 69 86  Resp: 14 20  Temp: 97.9 F (36.6 C) 98.1 F (36.7 C)  SpO2: 95% 94%   Vitals:   04/10/20 1847 04/10/20 2125 04/11/20 0139 04/11/20 0433  BP: (!) 148/79 (!) 141/102 (!) 138/95 (!) 155/108  Pulse: 79 (!) 106 69 86  Resp: 14 20 14 20   Temp: 99.8 F (37.7 C) 99.1 F (37.3 C) 97.9 F (36.6 C) 98.1 F (36.7 C)  TempSrc: Oral Oral Oral Oral  SpO2: 99% 91% 95% 94%  Weight:      Height:        General appearance : Awake, alert, not in any distress. Speech Clear. Not toxic looking HEENT: Atraumatic and Normocephalic, pupils equally reactive to light and accomodation Neck: Supple, no JVD. No cervical lymphadenopathy.  Chest: Good air entry bilaterally, no added sounds  CVS: S1 S2 regular, no murmurs.  Abdomen: Bowel sounds present, Non tender and not distended with no guarding, rigidity or rebound. Extremities: B/L Lower Ext shows no edema, both legs are warm to touch Neurology: Awake alert, and oriented X 3, CN II-XII intact, Non focal Skin: No Rash  Discharge Instructions  Discharge Instructions    Discharge patient   Complete by: As directed    Discharge disposition: 01-Home or Self Care   Discharge patient date: 04/11/2020     Allergies as of 04/11/2020      Reactions   Morphine And Related    nausea   Tylenol [acetaminophen]    nausea      Medication List    STOP taking these medications   hydroxyurea 500 MG capsule Commonly known as: HYDREA     TAKE these medications   cephALEXin 500 MG capsule Commonly known as: KEFLEX Take 1 capsule (500 mg total) by mouth every 6 (six) hours for 5 days.   ibuprofen 800 MG tablet Commonly known as: ADVIL Take 1 tablet (800 mg total) by mouth every 8 (eight) hours as needed. What changed:   medication strength  how much to take  when to take this  reasons to take this   Oxycodone HCl 10 MG Tabs Take 0.5 tablets (5 mg total) by mouth every 6 (six) hours as needed. What changed:    medication strength  when to take this       The results of significant diagnostics from this hospitalization (including imaging, microbiology, ancillary and laboratory) are listed below for reference.    Significant Diagnostic Studies: No results found.  Microbiology: Recent Results (from the past 240 hour(s))  Resp Panel by RT-PCR (Flu A&B, Covid) Nasopharyngeal Swab     Status: None   Collection Time: 04/08/20  4:35 AM   Specimen: Nasopharyngeal Swab; Nasopharyngeal(NP) swabs in vial transport medium  Result Value Ref Range Status   SARS Coronavirus 2 by RT PCR NEGATIVE NEGATIVE Final    Comment: (NOTE) SARS-CoV-2 target nucleic acids are NOT DETECTED.  The SARS-CoV-2 RNA is generally detectable in upper respiratory specimens during the acute phase of infection. The lowest concentration of SARS-CoV-2 viral copies this assay can detect is 138 copies/mL. A negative result does not preclude SARS-Cov-2 infection and should not be used as the sole basis for treatment or other patient management decisions. A negative  result may occur with  improper specimen collection/handling, submission of specimen other than nasopharyngeal swab, presence of viral mutation(s) within the areas targeted by this assay, and inadequate number of viral copies(<138 copies/mL). A negative result must be combined with clinical observations, patient history, and epidemiological information. The expected result is Negative.  Fact Sheet for Patients:  BloggerCourse.com  Fact Sheet for Healthcare Providers:  SeriousBroker.it  This test is no t yet approved or cleared by the [place] FDA and  has been authorized for detection and/or diagnosis of SARS-CoV-2 by FDA under an Emergency Use Authorization (EUA). This EUA will remain  in effect (meaning this test can be used) for the duration of the COVID-19 declaration under Section 564(b)(1) of the  Act, 21 U.S.C.section 360bbb-3(b)(1), unless the authorization is terminated  or revoked sooner.       Influenza A by PCR NEGATIVE NEGATIVE Final   Influenza B by PCR NEGATIVE NEGATIVE Final    Comment: (NOTE) The Xpert Xpress SARS-CoV-2/FLU/RSV plus assay is intended as an aid in the diagnosis of influenza from Nasopharyngeal swab specimens and should not be used as a sole basis for treatment. Nasal washings and aspirates are unacceptable for Xpert Xpress SARS-CoV-2/FLU/RSV testing.  Fact Sheet for Patients: BloggerCourse.com  Fact Sheet for Healthcare Providers: SeriousBroker.it  This test is not yet approved or cleared by the [place] FDA and has been authorized for detection and/or diagnosis of SARS-CoV-2 by FDA under an Emergency Use Authorization (EUA). This EUA will remain in effect (meaning this test can be used) for the duration of the COVID-19 declaration under Section 564(b)(1) of the Act, 21 U.S.C. section 360bbb-3(b)(1), unless the authorization is terminated or revoked.  Performed at [facility], [address] [place], [place] [address]   Culture, Urine     Status: Abnormal   Collection Time: 04/09/20  3:40 AM   Specimen: Urine, Random  Result Value Ref Range Status   Specimen Description   Final    URINE, RANDOM Performed at [facility], [address], [place], [place] [address]    Special Requests   Final    NONE Performed at [facility], [address], [place], [place] [address]    Culture >=100,000 COLONIES/mL ESCHERICHIA COLI (A)  Final   Report Status 04/11/2020 FINAL  Final   Organism ID, Bacteria ESCHERICHIA COLI (A)  Final      Susceptibility   Escherichia coli - MIC*    AMPICILLIN 4 SENSITIVE Sensitive     CEFAZOLIN <=4 SENSITIVE Sensitive     CEFEPIME <=0.12 SENSITIVE Sensitive     CEFTRIAXONE <=0.25 SENSITIVE Sensitive      CIPROFLOXACIN <=0.25 SENSITIVE Sensitive     GENTAMICIN <=1 SENSITIVE Sensitive     IMIPENEM <=0.25 SENSITIVE Sensitive     NITROFURANTOIN <=16 SENSITIVE Sensitive     TRIMETH/SULFA <=20 SENSITIVE Sensitive     AMPICILLIN/SULBACTAM <=2 SENSITIVE Sensitive     PIP/TAZO <=4 SENSITIVE Sensitive     * >=100,000 COLONIES/mL ESCHERICHIA COLI     Labs: Basic Metabolic Panel: Recent Labs  Lab 04/07/20 2348  NA 139  K 3.7  CL 108  CO2 22  GLUCOSE 106*  BUN 7  CREATININE 0.30*  CALCIUM 9.4   Liver Function Tests: Recent Labs  Lab 04/07/20 2348  AST 61*  ALT 24  ALKPHOS 96  BILITOT 3.8*  PROT 8.2*  ALBUMIN 4.7   No results for input(s): LIPASE, AMYLASE in the last 168  hours. No results for input(s): AMMONIA in the last 168 hours. CBC: Recent Labs  Lab 04/07/20 2348 04/09/20 0529 04/10/20 0532  WBC 12.5* 9.1 9.7  NEUTROABS 6.6  --   --   HGB 8.8* 8.8* 8.1*  HCT 23.6* 23.9* 22.9*  MCV 97.1 97.6 100.9*  PLT 288 260 221   Cardiac Enzymes: No results for input(s): CKTOTAL, CKMB, CKMBINDEX, TROPONINI in the last 168 hours. BNP: Invalid input(s): POCBNP CBG: No results for input(s): GLUCAP in the last 168 hours.  Time coordinating discharge: 35 minutes  Signed:  Nolon NationsLachina Moore Tayana Shankle  APRN, MSN, FNP-C [facility] [address]  [place], [place] [id number] [phone]  [facility] 04/11/2020, 9:53 AM

## 2020-04-12 DIAGNOSIS — N3000 Acute cystitis without hematuria: Secondary | ICD-10-CM | POA: Diagnosis not present

## 2020-04-12 DIAGNOSIS — N390 Urinary tract infection, site not specified: Secondary | ICD-10-CM

## 2020-04-12 DIAGNOSIS — D5700 Hb-SS disease with crisis, unspecified: Secondary | ICD-10-CM | POA: Diagnosis not present

## 2020-04-12 DIAGNOSIS — B9620 Unspecified Escherichia coli [E. coli] as the cause of diseases classified elsewhere: Secondary | ICD-10-CM

## 2020-04-12 LAB — BASIC METABOLIC PANEL
Anion gap: 10 (ref 5–15)
BUN: <5 mg/dL — ABNORMAL LOW (ref 6–20)
CO2: 27 mmol/L (ref 22–32)
Calcium: 9.5 mg/dL (ref 8.9–10.3)
Chloride: 104 mmol/L (ref 98–111)
Creatinine, Ser: <0.3 mg/dL — ABNORMAL LOW (ref 0.44–1.00)
Glucose, Bld: 103 mg/dL — ABNORMAL HIGH (ref 70–99)
Potassium: 2.9 mmol/L — ABNORMAL LOW (ref 3.5–5.1)
Sodium: 141 mmol/L (ref 135–145)

## 2020-04-12 LAB — CBC
HCT: 24.8 % — ABNORMAL LOW (ref 36.0–46.0)
Hemoglobin: 8.8 g/dL — ABNORMAL LOW (ref 12.0–15.0)
MCH: 35.6 pg — ABNORMAL HIGH (ref 26.0–34.0)
MCHC: 35.5 g/dL (ref 30.0–36.0)
MCV: 100.4 fL — ABNORMAL HIGH (ref 80.0–100.0)
Platelets: 290 10*3/uL (ref 150–400)
RBC: 2.47 MIL/uL — ABNORMAL LOW (ref 3.87–5.11)
RDW: 20.7 % — ABNORMAL HIGH (ref 11.5–15.5)
WBC: 7.3 10*3/uL (ref 4.0–10.5)
nRBC: 2.9 % — ABNORMAL HIGH (ref 0.0–0.2)

## 2020-04-12 MED ORDER — POTASSIUM CHLORIDE CRYS ER 20 MEQ PO TBCR
40.0000 meq | EXTENDED_RELEASE_TABLET | Freq: Once | ORAL | Status: AC
  Administered 2020-04-12: 40 meq via ORAL
  Filled 2020-04-12: qty 2

## 2020-04-12 MED ORDER — HYDROMORPHONE HCL 2 MG/ML IJ SOLN
2.0000 mg | INTRAMUSCULAR | Status: DC | PRN
  Administered 2020-04-13 – 2020-04-14 (×4): 2 mg via INTRAVENOUS
  Filled 2020-04-12 (×4): qty 1

## 2020-04-12 NOTE — Progress Notes (Signed)
Subjective: Brooke Patterson is a 20 year old female with a medical history significant for sickle cell disease that was admitted for sickle cell pain crisis. Today, patient was ready for discharge with minimal pain this a.m.  However, sickle cell pain returned suddenly as patient was awaiting discharge.  Discharge was rescinded.  IV Dilaudid and IV fluids restarted.  Patient says that pain intensity is 10/10 primarily to upper extremities.  Patient denies any headache, chest pain, urinary symptoms, nausea, vomiting, or diarrhea.  Objective:  Vital signs in last 24 hours:  Vitals:   04/11/20 2015 04/12/20 0016 04/12/20 0439 04/12/20 0944  BP: 123/82 (!) 159/110 (!) 142/97 (!) 140/106  Pulse: 81 80 79 75  Resp: 16 16 14 18   Temp: 98.5 F (36.9 C) 98.2 F (36.8 C) 99.1 F (37.3 C) 99.2 F (37.3 C)  TempSrc: Oral Oral Oral Oral  SpO2: 95% 94% 100% 100%  Weight:      Height:        Intake/Output from previous day:   Intake/Output Summary (Last 24 hours) at 04/12/2020 1029 Last data filed at 04/11/2020 2346 Gross per 24 hour  Intake 622.68 ml  Output 40 ml  Net 582.68 ml    Physical Exam: General: Alert, awake, oriented x3, in no acute distress.  HEENT: [place]/AT PEERL, EOMI Neck: Trachea midline,  no masses, no thyromegal,y no JVD, no carotid bruit OROPHARYNX:  Moist, No exudate/ erythema/lesions.  Heart: Regular rate and rhythm, without murmurs, rubs, gallops, PMI non-displaced, no heaves or thrills on palpation.  Lungs: Clear to auscultation, no wheezing or rhonchi noted. No increased vocal fremitus resonant to percussion  Abdomen: Soft, nontender, nondistended, positive bowel sounds, no masses no hepatosplenomegaly noted..  Neuro: No focal neurological deficits noted cranial nerves II through XII grossly intact. DTRs 2+ bilaterally upper and lower extremities. Strength 5 out of 5 in bilateral upper and lower extremities. Musculoskeletal: No warm swelling or erythema around joints,  no spinal tenderness noted. Psychiatric: Patient alert and oriented x3, good insight and cognition, good recent to remote recall. Lymph node survey: No cervical axillary or inguinal lymphadenopathy noted.  Lab Results:  Basic Metabolic Panel:    Component Value Date/Time   NA 139 04/07/2020 2348   K 3.7 04/07/2020 2348   CL 108 04/07/2020 2348   CO2 22 04/07/2020 2348   BUN 7 04/07/2020 2348   CREATININE 0.30 (L) 04/07/2020 2348   GLUCOSE 106 (H) 04/07/2020 2348   CALCIUM 9.4 04/07/2020 2348   CBC:    Component Value Date/Time   WBC 9.7 04/10/2020 0532   HGB 8.1 (L) 04/10/2020 0532   HCT 22.9 (L) 04/10/2020 0532   PLT 221 04/10/2020 0532   MCV 100.9 (H) 04/10/2020 0532   NEUTROABS 6.6 04/07/2020 2348   LYMPHSABS 4.9 (H) 04/07/2020 2348   MONOABS 0.7 04/07/2020 2348   EOSABS 0.1 04/07/2020 2348   BASOSABS 0.1 04/07/2020 2348    Recent Results (from the past 240 hour(s))  Resp Panel by RT-PCR (Flu A&B, Covid) Nasopharyngeal Swab     Status: None   Collection Time: 04/08/20  4:35 AM   Specimen: Nasopharyngeal Swab; Nasopharyngeal(NP) swabs in vial transport medium  Result Value Ref Range Status   SARS Coronavirus 2 by RT PCR NEGATIVE NEGATIVE Final    Comment: (NOTE) SARS-CoV-2 target nucleic acids are NOT DETECTED.  The SARS-CoV-2 RNA is generally detectable in upper respiratory specimens during the acute phase of infection. The lowest concentration of SARS-CoV-2 viral copies this assay can detect  is 138 copies/mL. A negative result does not preclude SARS-Cov-2 infection and should not be used as the sole basis for treatment or other patient management decisions. A negative result may occur with  improper specimen collection/handling, submission of specimen other than nasopharyngeal swab, presence of viral mutation(s) within the areas targeted by this assay, and inadequate number of viral copies(<138 copies/mL). A negative result must be combined with clinical  observations, patient history, and epidemiological information. The expected result is Negative.  Fact Sheet for Patients:  BloggerCourse.com  Fact Sheet for Healthcare Providers:  SeriousBroker.it  This test is no t yet approved or cleared by the [place] FDA and  has been authorized for detection and/or diagnosis of SARS-CoV-2 by FDA under an Emergency Use Authorization (EUA). This EUA will remain  in effect (meaning this test can be used) for the duration of the COVID-19 declaration under Section 564(b)(1) of the Act, 21 U.S.C.section 360bbb-3(b)(1), unless the authorization is terminated  or revoked sooner.       Influenza A by PCR NEGATIVE NEGATIVE Final   Influenza B by PCR NEGATIVE NEGATIVE Final    Comment: (NOTE) The Xpert Xpress SARS-CoV-2/FLU/RSV plus assay is intended as an aid in the diagnosis of influenza from Nasopharyngeal swab specimens and should not be used as a sole basis for treatment. Nasal washings and aspirates are unacceptable for Xpert Xpress SARS-CoV-2/FLU/RSV testing.  Fact Sheet for Patients: BloggerCourse.com  Fact Sheet for Healthcare Providers: SeriousBroker.it  This test is not yet approved or cleared by the [place] FDA and has been authorized for detection and/or diagnosis of SARS-CoV-2 by FDA under an Emergency Use Authorization (EUA). This EUA will remain in effect (meaning this test can be used) for the duration of the COVID-19 declaration under Section 564(b)(1) of the Act, 21 U.S.C. section 360bbb-3(b)(1), unless the authorization is terminated or revoked.  Performed at [facility], [address], [place], [place] [address]   Culture, Urine     Status: Abnormal   Collection Time: 04/09/20  3:40 AM   Specimen: Urine, Random  Result Value Ref Range Status   Specimen Description   Final    URINE,  RANDOM Performed at [facility], [address], [place], [place] [address]    Special Requests   Final    NONE Performed at [facility], [address], [place], [place] [address]    Culture >=100,000 COLONIES/mL ESCHERICHIA COLI (A)  Final   Report Status 04/11/2020 FINAL  Final   Organism ID, Bacteria ESCHERICHIA COLI (A)  Final      Susceptibility   Escherichia coli - MIC*    AMPICILLIN 4 SENSITIVE Sensitive     CEFAZOLIN <=4 SENSITIVE Sensitive     CEFEPIME <=0.12 SENSITIVE Sensitive     CEFTRIAXONE <=0.25 SENSITIVE Sensitive     CIPROFLOXACIN <=0.25 SENSITIVE Sensitive     GENTAMICIN <=1 SENSITIVE Sensitive     IMIPENEM <=0.25 SENSITIVE Sensitive     NITROFURANTOIN <=16 SENSITIVE Sensitive     TRIMETH/SULFA <=20 SENSITIVE Sensitive     AMPICILLIN/SULBACTAM <=2 SENSITIVE Sensitive     PIP/TAZO <=4 SENSITIVE Sensitive     * >=100,000 COLONIES/mL ESCHERICHIA COLI    Studies/Results: No results found.  Medications: Scheduled Meds: . cephALEXin  500 mg Oral Q6H  . enoxaparin (LOVENOX) injection  40 mg Subcutaneous Q24H  . ketorolac  15 mg Intravenous Q6H  . senna-docusate  1 tablet Oral BID   Continuous Infusions: . sodium  chloride 100 mL/hr at 04/12/20 0337   PRN Meds:.HYDROmorphone (DILAUDID) injection, oxyCODONE, polyethylene glycol  Consultants:  None  Procedures:  None  Antibiotics:  Cephalexin  Assessment/Plan: Principal Problem:   Sickle cell pain crisis (HCC) Active Problems:   Acute cystitis without hematuria   E-coli UTI  Sickle cell disease with pain crisis: Restart IV fluids, 0.45% saline at 100 mL/h Toradol 15 mg IV for total of 5 days Dilaudid 2 mg IV every 2 hours as needed Monitor vital signs closely, reevaluate pain scale regularly, and supplemental oxygen as needed.  Urinary tract infection with E. coli: IV ceftriaxone discontinued on 04/12/2019.  Patient transition to cephalexin 500 mg every  6 hours for total of 5 days  Sickle cell anemia: Hemoglobin is stable and consistent with patient's baseline.  There is no clinical indication for blood transfusion.  Follow CBC in AM.  Leukocytosis: Resolved.  Continue to follow closely.  Patient remains afebrile.  Transition to oral antibiotics today.  Continue to follow labs closely.    Code Status: Full Code Family Communication: N/A Disposition Plan: Not yet ready for discharge.  Will reassess for discharge in a.m.  Nolon Nations  APRN, MSN, FNP-C [facility] [address] [place], [place] [address] [phone]  If 7PM-7AM, please contact night-coverage.  04/12/2020, 10:29 AM  LOS: 4 days

## 2020-04-12 NOTE — Progress Notes (Signed)
Patient ID: Brooke Patterson, female   DOB: 2000/05/14, 20 y.o.   MRN: [id number] Subjective: Brooke Patterson is a 20 year old female with a medical history significant for sickle cell disease that was admitted for sickle cell pain crisis.  Patient was seen comfortably sitting on the couch but she is complaining of pain 10 out of 10, she claims she is now ready for discharge.  She denies any fever, headache, cough, chest pain, urinary symptoms, nausea, vomiting or diarrhea.  Objective:  Vital signs in last 24 hours:  Vitals:   04/11/20 2015 04/12/20 0016 04/12/20 0439 04/12/20 0944  BP: 123/82 (!) 159/110 (!) 142/97 (!) 140/106  Pulse: 81 80 79 75  Resp: 16 16 14 18   Temp: 98.5 F (36.9 C) 98.2 F (36.8 C) 99.1 F (37.3 C) 99.2 F (37.3 C)  TempSrc: Oral Oral Oral Oral  SpO2: 95% 94% 100% 100%  Weight:      Height:        Intake/Output from previous day:   Intake/Output Summary (Last 24 hours) at 04/12/2020 1317 Last data filed at 04/11/2020 2346 Gross per 24 hour  Intake 622.68 ml  Output 40 ml  Net 582.68 ml    Physical Exam: General: Alert, awake, oriented x3, in no acute distress.  HEENT: [place]/AT PEERL, EOMI Neck: Trachea midline,  no masses, no thyromegal,y no JVD, no carotid bruit OROPHARYNX:  Moist, No exudate/ erythema/lesions.  Heart: Regular rate and rhythm, without murmurs, rubs, gallops, PMI non-displaced, no heaves or thrills on palpation.  Lungs: Clear to auscultation, no wheezing or rhonchi noted. No increased vocal fremitus resonant to percussion  Abdomen: Soft, nontender, nondistended, positive bowel sounds, no masses no hepatosplenomegaly noted..  Neuro: No focal neurological deficits noted cranial nerves II through XII grossly intact. DTRs 2+ bilaterally upper and lower extremities. Strength 5 out of 5 in bilateral upper and lower extremities. Musculoskeletal: No warm swelling or erythema around joints, no spinal tenderness noted. Psychiatric: Patient alert  and oriented x3, good insight and cognition, good recent to remote recall. Lymph node survey: No cervical axillary or inguinal lymphadenopathy noted.  Lab Results:  Basic Metabolic Panel:    Component Value Date/Time   NA 141 04/12/2020 1056   K 2.9 (L) 04/12/2020 1056   CL 104 04/12/2020 1056   CO2 27 04/12/2020 1056   BUN <5 (L) 04/12/2020 1056   CREATININE <0.30 (L) 04/12/2020 1056   GLUCOSE 103 (H) 04/12/2020 1056   CALCIUM 9.5 04/12/2020 1056   CBC:    Component Value Date/Time   WBC 7.3 04/12/2020 1056   HGB 8.8 (L) 04/12/2020 1056   HCT 24.8 (L) 04/12/2020 1056   PLT 290 04/12/2020 1056   MCV 100.4 (H) 04/12/2020 1056   NEUTROABS 6.6 04/07/2020 2348   LYMPHSABS 4.9 (H) 04/07/2020 2348   MONOABS 0.7 04/07/2020 2348   EOSABS 0.1 04/07/2020 2348   BASOSABS 0.1 04/07/2020 2348    Recent Results (from the past 240 hour(s))  Resp Panel by RT-PCR (Flu A&B, Covid) Nasopharyngeal Swab     Status: None   Collection Time: 04/08/20  4:35 AM   Specimen: Nasopharyngeal Swab; Nasopharyngeal(NP) swabs in vial transport medium  Result Value Ref Range Status   SARS Coronavirus 2 by RT PCR NEGATIVE NEGATIVE Final    Comment: (NOTE) SARS-CoV-2 target nucleic acids are NOT DETECTED.  The SARS-CoV-2 RNA is generally detectable in upper respiratory specimens during the acute phase of infection. The lowest concentration of SARS-CoV-2 viral copies this assay can detect  is 138 copies/mL. A negative result does not preclude SARS-Cov-2 infection and should not be used as the sole basis for treatment or other patient management decisions. A negative result may occur with  improper specimen collection/handling, submission of specimen other than nasopharyngeal swab, presence of viral mutation(s) within the areas targeted by this assay, and inadequate number of viral copies(<138 copies/mL). A negative result must be combined with clinical observations, patient history, and  epidemiological information. The expected result is Negative.  Fact Sheet for Patients:  BloggerCourse.com  Fact Sheet for Healthcare Providers:  SeriousBroker.it  This test is no t yet approved or cleared by the [place] FDA and  has been authorized for detection and/or diagnosis of SARS-CoV-2 by FDA under an Emergency Use Authorization (EUA). This EUA will remain  in effect (meaning this test can be used) for the duration of the COVID-19 declaration under Section 564(b)(1) of the Act, 21 U.S.C.section 360bbb-3(b)(1), unless the authorization is terminated  or revoked sooner.       Influenza A by PCR NEGATIVE NEGATIVE Final   Influenza B by PCR NEGATIVE NEGATIVE Final    Comment: (NOTE) The Xpert Xpress SARS-CoV-2/FLU/RSV plus assay is intended as an aid in the diagnosis of influenza from Nasopharyngeal swab specimens and should not be used as a sole basis for treatment. Nasal washings and aspirates are unacceptable for Xpert Xpress SARS-CoV-2/FLU/RSV testing.  Fact Sheet for Patients: BloggerCourse.com  Fact Sheet for Healthcare Providers: SeriousBroker.it  This test is not yet approved or cleared by the [place] FDA and has been authorized for detection and/or diagnosis of SARS-CoV-2 by FDA under an Emergency Use Authorization (EUA). This EUA will remain in effect (meaning this test can be used) for the duration of the COVID-19 declaration under Section 564(b)(1) of the Act, 21 U.S.C. section 360bbb-3(b)(1), unless the authorization is terminated or revoked.  Performed at [facility], [address], [place], [place] [address]   Culture, Urine     Status: Abnormal   Collection Time: 04/09/20  3:40 AM   Specimen: Urine, Random  Result Value Ref Range Status   Specimen Description   Final    URINE, RANDOM Performed at [facility], [address], [place], [place] [address]    Special Requests   Final    NONE Performed at [facility], [address], [place], [place] [address]    Culture >=100,000 COLONIES/mL ESCHERICHIA COLI (A)  Final   Report Status 04/11/2020 FINAL  Final   Organism ID, Bacteria ESCHERICHIA COLI (A)  Final      Susceptibility   Escherichia coli - MIC*    AMPICILLIN 4 SENSITIVE Sensitive     CEFAZOLIN <=4 SENSITIVE Sensitive     CEFEPIME <=0.12 SENSITIVE Sensitive     CEFTRIAXONE <=0.25 SENSITIVE Sensitive     CIPROFLOXACIN <=0.25 SENSITIVE Sensitive     GENTAMICIN <=1 SENSITIVE Sensitive     IMIPENEM <=0.25 SENSITIVE Sensitive     NITROFURANTOIN <=16 SENSITIVE Sensitive     TRIMETH/SULFA <=20 SENSITIVE Sensitive     AMPICILLIN/SULBACTAM <=2 SENSITIVE Sensitive     PIP/TAZO <=4 SENSITIVE Sensitive     * >=100,000 COLONIES/mL ESCHERICHIA COLI    Studies/Results: No results found.  Medications: Scheduled Meds: . cephALEXin  500 mg Oral Q6H  . enoxaparin (LOVENOX) injection  40 mg Subcutaneous Q24H  . ketorolac  15 mg Intravenous Q6H  . potassium chloride SA  40 mEq Oral Once  . senna-docusate  1 tablet Oral BID   Continuous Infusions: . sodium chloride 100 mL/hr at 04/12/20 0337   PRN Meds:.HYDROmorphone (DILAUDID) injection, oxyCODONE, polyethylene glycol  Consultants:  None  Procedures:  None  Antibiotics:  None  Assessment/Plan: Principal Problem:   Sickle cell pain crisis (HCC) Active Problems:   Acute cystitis without hematuria   E-coli UTI  1. Hb Sickle Cell Disease with crisis: Decrease IVF 0.45% Saline to 50 mls/hour, continue Dilaudid 2 mg every 2 hours as needed, continue IV Toradol 15 mg Q 6 H for total of 5 days, Monitor vitals very closely, Re-evaluate pain scale regularly, 2 L of Oxygen by [place]. 2. Urinary tract infection with E. Coli: Patient is transitioned to oral cephalexin 500 mg every 6 hourly for total of 5 days,  tolerating it well.  Continue 3. Leukocytosis: Resolved 4. Sickle Cell Anemia: Hemoglobin is 8.8 this morning, around patient's baseline.  No clinical indication for blood transfusion today.  We will follow CBC in a.m. 5. Chronic pain Syndrome: Continue oral home pain medications.  Code Status: Full Code Family Communication: N/A Disposition Plan: Not yet ready for discharge  Brooke Patterson  If 7PM-7AM, please contact night-coverage.  04/12/2020, 1:17 PM  LOS: 4 days

## 2020-04-13 DIAGNOSIS — N3000 Acute cystitis without hematuria: Secondary | ICD-10-CM | POA: Diagnosis not present

## 2020-04-13 DIAGNOSIS — N390 Urinary tract infection, site not specified: Secondary | ICD-10-CM | POA: Diagnosis not present

## 2020-04-13 DIAGNOSIS — B9620 Unspecified Escherichia coli [E. coli] as the cause of diseases classified elsewhere: Secondary | ICD-10-CM | POA: Diagnosis not present

## 2020-04-13 DIAGNOSIS — D5700 Hb-SS disease with crisis, unspecified: Secondary | ICD-10-CM | POA: Diagnosis not present

## 2020-04-13 LAB — BASIC METABOLIC PANEL
Anion gap: 8 (ref 5–15)
BUN: <5 mg/dL — ABNORMAL LOW (ref 6–20)
CO2: 25 mmol/L (ref 22–32)
Calcium: 9.1 mg/dL (ref 8.9–10.3)
Chloride: 105 mmol/L (ref 98–111)
Creatinine, Ser: <0.3 mg/dL — ABNORMAL LOW (ref 0.44–1.00)
Glucose, Bld: 94 mg/dL (ref 70–99)
Potassium: 3.7 mmol/L (ref 3.5–5.1)
Sodium: 138 mmol/L (ref 135–145)

## 2020-04-13 NOTE — Progress Notes (Signed)
TOC CM chart reviewed, no dc needs identified. Isidoro Donning RN CCM, [facility] ED TOC CM [phone]

## 2020-04-13 NOTE — Progress Notes (Signed)
Patient ID: Brooke Patterson, female   DOB: 2000-07-28, 20 y.o.   MRN: [id number] Subjective: Brooke Patterson is a 20 year old female with a medical history significant for sickle cell disease that was admitted for sickle cell pain crisis.  Patient claims her pain returned this morning warranting IV Dilaudid push, she still think she cannot go home because of fear of coming right back to the hospital.  Pain is 10/10, constant and throbbing.  Patient's father was on the phone during this encounter, said he will not be able to come to [facility] to take her to the ED should the pain return shortly after discharge as planned.  She has no new complaint today.  She denies any fever, headache, cough, chest pain, nausea, vomiting or diarrhea.  No urinary symptoms.  Objective:  Vital signs in last 24 hours:  Vitals:   04/13/20 0141 04/13/20 0459 04/13/20 0946 04/13/20 1332  BP: 130/79 138/79 (!) 148/89 (!) 169/108  Pulse: 71 72 71 93  Resp: 18 18 13 17   Temp: 98.1 F (36.7 C) 98.1 F (36.7 C) 98.3 F (36.8 C) 99.6 F (37.6 C)  TempSrc: Oral Oral Oral Oral  SpO2: 95% 98% 96% 100%  Weight:      Height:        Intake/Output from previous day:   Intake/Output Summary (Last 24 hours) at 04/13/2020 1530 Last data filed at 04/13/2020 0459 Gross per 24 hour  Intake 360 ml  Output --  Net 360 ml    Physical Exam: General: Alert, awake, oriented x3, in no acute distress.  HEENT: [place]/AT PEERL, EOMI Neck: Trachea midline,  no masses, no thyromegal,y no JVD, no carotid bruit OROPHARYNX:  Moist, No exudate/ erythema/lesions.  Heart: Regular rate and rhythm, without murmurs, rubs, gallops, PMI non-displaced, no heaves or thrills on palpation.  Lungs: Clear to auscultation, no wheezing or rhonchi noted. No increased vocal fremitus resonant to percussion  Abdomen: Soft, nontender, nondistended, positive bowel sounds, no masses no hepatosplenomegaly noted..  Neuro: No focal neurological deficits noted  cranial nerves II through XII grossly intact. DTRs 2+ bilaterally upper and lower extremities. Strength 5 out of 5 in bilateral upper and lower extremities. Musculoskeletal: No warm swelling or erythema around joints, no spinal tenderness noted. Psychiatric: Patient alert and oriented x3, good insight and cognition, good recent to remote recall. Lymph node survey: No cervical axillary or inguinal lymphadenopathy noted.  Lab Results:  Basic Metabolic Panel:    Component Value Date/Time   NA 138 04/13/2020 1314   K 3.7 04/13/2020 1314   CL 105 04/13/2020 1314   CO2 25 04/13/2020 1314   BUN <5 (L) 04/13/2020 1314   CREATININE <0.30 (L) 04/13/2020 1314   GLUCOSE 94 04/13/2020 1314   CALCIUM 9.1 04/13/2020 1314   CBC:    Component Value Date/Time   WBC 7.3 04/12/2020 1056   HGB 8.8 (L) 04/12/2020 1056   HCT 24.8 (L) 04/12/2020 1056   PLT 290 04/12/2020 1056   MCV 100.4 (H) 04/12/2020 1056   NEUTROABS 6.6 04/07/2020 2348   LYMPHSABS 4.9 (H) 04/07/2020 2348   MONOABS 0.7 04/07/2020 2348   EOSABS 0.1 04/07/2020 2348   BASOSABS 0.1 04/07/2020 2348    Recent Results (from the past 240 hour(s))  Resp Panel by RT-PCR (Flu A&B, Covid) Nasopharyngeal Swab     Status: None   Collection Time: 04/08/20  4:35 AM   Specimen: Nasopharyngeal Swab; Nasopharyngeal(NP) swabs in vial transport medium  Result Value Ref Range Status  SARS Coronavirus 2 by RT PCR NEGATIVE NEGATIVE Final    Comment: (NOTE) SARS-CoV-2 target nucleic acids are NOT DETECTED.  The SARS-CoV-2 RNA is generally detectable in upper respiratory specimens during the acute phase of infection. The lowest concentration of SARS-CoV-2 viral copies this assay can detect is 138 copies/mL. A negative result does not preclude SARS-Cov-2 infection and should not be used as the sole basis for treatment or other patient management decisions. A negative result may occur with  improper specimen collection/handling, submission of  specimen other than nasopharyngeal swab, presence of viral mutation(s) within the areas targeted by this assay, and inadequate number of viral copies(<138 copies/mL). A negative result must be combined with clinical observations, patient history, and epidemiological information. The expected result is Negative.  Fact Sheet for Patients:  BloggerCourse.com  Fact Sheet for Healthcare Providers:  SeriousBroker.it  This test is no t yet approved or cleared by the [place] FDA and  has been authorized for detection and/or diagnosis of SARS-CoV-2 by FDA under an Emergency Use Authorization (EUA). This EUA will remain  in effect (meaning this test can be used) for the duration of the COVID-19 declaration under Section 564(b)(1) of the Act, 21 U.S.C.section 360bbb-3(b)(1), unless the authorization is terminated  or revoked sooner.       Influenza A by PCR NEGATIVE NEGATIVE Final   Influenza B by PCR NEGATIVE NEGATIVE Final    Comment: (NOTE) The Xpert Xpress SARS-CoV-2/FLU/RSV plus assay is intended as an aid in the diagnosis of influenza from Nasopharyngeal swab specimens and should not be used as a sole basis for treatment. Nasal washings and aspirates are unacceptable for Xpert Xpress SARS-CoV-2/FLU/RSV testing.  Fact Sheet for Patients: BloggerCourse.com  Fact Sheet for Healthcare Providers: SeriousBroker.it  This test is not yet approved or cleared by the [place] FDA and has been authorized for detection and/or diagnosis of SARS-CoV-2 by FDA under an Emergency Use Authorization (EUA). This EUA will remain in effect (meaning this test can be used) for the duration of the COVID-19 declaration under Section 564(b)(1) of the Act, 21 U.S.C. section 360bbb-3(b)(1), unless the authorization is terminated or revoked.  Performed at [facility], [address] [place], [place] [address]   Culture, Urine     Status: Abnormal   Collection Time: 04/09/20  3:40 AM   Specimen: Urine, Random  Result Value Ref Range Status   Specimen Description   Final    URINE, RANDOM Performed at [facility], [address] [place], [place] [address]    Special Requests   Final    NONE Performed at [facility], [address] [place], [place] [address]    Culture >=100,000 COLONIES/mL ESCHERICHIA COLI (A)  Final   Report Status 04/11/2020 FINAL  Final   Organism ID, Bacteria ESCHERICHIA COLI (A)  Final      Susceptibility   Escherichia coli - MIC*    AMPICILLIN 4 SENSITIVE Sensitive     CEFAZOLIN <=4 SENSITIVE Sensitive     CEFEPIME <=0.12 SENSITIVE Sensitive     CEFTRIAXONE <=0.25 SENSITIVE Sensitive     CIPROFLOXACIN <=0.25 SENSITIVE Sensitive     GENTAMICIN <=1 SENSITIVE Sensitive     IMIPENEM <=0.25 SENSITIVE Sensitive     NITROFURANTOIN <=16 SENSITIVE Sensitive     TRIMETH/SULFA <=20 SENSITIVE Sensitive     AMPICILLIN/SULBACTAM <=2 SENSITIVE Sensitive     PIP/TAZO <=4 SENSITIVE Sensitive     * >=100,000 COLONIES/mL ESCHERICHIA COLI  Studies/Results: No results found.  Medications: Scheduled Meds: . cephALEXin  500 mg Oral Q6H  . enoxaparin (LOVENOX) injection  40 mg Subcutaneous Q24H  . senna-docusate  1 tablet Oral BID   Continuous Infusions: . sodium chloride 50 mL/hr at 04/12/20 1336   PRN Meds:.HYDROmorphone (DILAUDID) injection, oxyCODONE, polyethylene glycol  Consultants:  None  Procedures:  None  Antibiotics:  None  Assessment/Plan: Principal Problem:   Sickle cell pain crisis (HCC) Active Problems:   Acute cystitis without hematuria   E-coli UTI  1. Hb Sickle Cell Disease with crisis: Decrease IVF 0.45% Saline to [facility], continue Dilaudid 2 mg every 2 hours as needed, start oral oxycodone.  Completed IV Toradol 15 mg Q 6 H for total of 5 days, Monitor vitals very  closely, Re-evaluate pain scale regularly, 2 L of Oxygen by [place].  Patient encouraged to ambulate. 2. Urinary tract infection with E. Coli: she is currently on cephalexin 500 mg every 6 hourly for total of 5 days, tolerating it well.  Will continue. 3. Hypokalemia: Repleted, potassium now within normal limits. 4. Leukocytosis: Resolved 5. Sickle Cell Anemia: Patient further endorses that she feels better after transfusion, but her hemoglobin is 8.8, around patient's baseline. No clinical indication for blood transfusion today. We will follow CBC in a.m. May transfuse if hemoglobin is below 8. 6. Chronic pain Syndrome: Continue oral home pain medications.  Code Status: Full Code Family Communication: N/A Disposition Plan: Not yet ready for discharge  Brooke Patterson  If 7PM-7AM, please contact night-coverage.  04/13/2020, 3:30 PM  LOS: 5 days

## 2020-04-14 ENCOUNTER — Encounter: Payer: Self-pay | Admitting: Family Medicine

## 2020-04-14 DIAGNOSIS — D5700 Hb-SS disease with crisis, unspecified: Secondary | ICD-10-CM | POA: Diagnosis not present

## 2020-04-14 LAB — CBC WITH DIFFERENTIAL/PLATELET
Abs Immature Granulocytes: 0.04 10*3/uL (ref 0.00–0.07)
Basophils Absolute: 0 10*3/uL (ref 0.0–0.1)
Basophils Relative: 0 %
Eosinophils Absolute: 0.2 10*3/uL (ref 0.0–0.5)
Eosinophils Relative: 3 %
HCT: 23.8 % — ABNORMAL LOW (ref 36.0–46.0)
Hemoglobin: 8.3 g/dL — ABNORMAL LOW (ref 12.0–15.0)
Immature Granulocytes: 1 %
Lymphocytes Relative: 24 %
Lymphs Abs: 2.1 10*3/uL (ref 0.7–4.0)
MCH: 35.2 pg — ABNORMAL HIGH (ref 26.0–34.0)
MCHC: 34.9 g/dL (ref 30.0–36.0)
MCV: 100.8 fL — ABNORMAL HIGH (ref 80.0–100.0)
Monocytes Absolute: 0.9 10*3/uL (ref 0.1–1.0)
Monocytes Relative: 11 %
Neutro Abs: 5.4 10*3/uL (ref 1.7–7.7)
Neutrophils Relative %: 61 %
Platelets: 243 10*3/uL (ref 150–400)
RBC: 2.36 MIL/uL — ABNORMAL LOW (ref 3.87–5.11)
RDW: 19.1 % — ABNORMAL HIGH (ref 11.5–15.5)
WBC: 8.8 10*3/uL (ref 4.0–10.5)
nRBC: 1.8 % — ABNORMAL HIGH (ref 0.0–0.2)

## 2020-04-14 NOTE — Discharge Summary (Signed)
Physician Discharge Summary  Brooke BurkittDanielle Christoffersen ZOX:[id number] DOB: May 30, 2000 DOA: 04/08/2020  PCP: Willey BladeShivadas, Anita, MD  Admit date: 04/08/2020  Discharge date: 04/14/2020  Discharge Diagnoses:  Principal Problem:   Sickle cell pain crisis [facility](HCC) Active Problems:   Acute cystitis without hematuria   E-coli UTI   Discharge Condition: Stable  Disposition:   Follow-up Information    Willey BladeShivadas, Anita, MD Follow up in 2 week(s).   Specialty: Family Medicine Contact information: [address] [place] [id number] [phone]              Pt is discharged home in good condition and is to follow up with Willey BladeShivadas, Anita, MD this week to have labs evaluated. Brooke BurkittDanielle Kite is instructed to increase activity slowly and balance with rest for the next few days, and use prescribed medication to complete treatment of pain  Diet: Regular Wt Readings from Last 3 Encounters:  04/08/20 63.6 kg    History of present illness:  Brooke Patterson is a 20 year old female with a medical history significant for sickle cell disease presented to the emergency department for evaluation of severe pain involving bilateral arms.  Patient was in usual state of health until 1 day prior when she developed some pain in the low back and bilateral arms.  The arm pain became severe and continued to worsen despite using oxycodone at home.  Patient denies any subjective fever, chills, cough, shortness of breath, or chest pain.  ER course: Upon arrival to the ER, patient was found to be afebrile, oxygen saturation mid 90s on room air, and with stable blood pressure.  Chemistry panel with slight elevation in AST and elevated bilirubin similar to prior.  Chemistry panel features of a leukocytosis to 12,500 and normocytic anemia with a hemoglobin of 8.8.  Patient was treated with IV fluids and multiple doses of IV antibiotics.  Patient reported minimal improvement in symptoms but continued to have severe pain.   Patient admitted to MedSurg for further management of sickle cell pain crisis.  Hospital Course:  Sickle cell disease with pain crisis: Patient was admitted for sickle cell pain crisis and managed appropriately with IVF, IV Dilaudid via PCA and IV Toradol, as well as other adjunct therapies per sickle cell pain management protocols.  IV Dilaudid PCA was weaned appropriately.  Patient transition to oxycodone.  To complete treatment oxycodone 10 mg #20 was sent to patient's pharmacy.  PDMP reviewed prior to prescribing opiate medications, no inconsistencies noted.  Patient's pain intensity is 3/10 and she feels that she can manage at home at this time.  Patient has infrequent pain crisis.  She has been lost to follow-up with hematology.  Advised to reestablish care. Discussed restarting disease modifying agents. Patient will also complete treatment with ibuprofen 800 mg every 8 hours as needed for mild to moderate pain. Advised to hydrate with 64 ounces of fluid daily  Acute cystitis without hematuria: Patient experienced urinary symptoms including dysuria.  Urinalysis showed increased bacteria and large leukocytes.  Urine culture yielded E. coli 100,000 colonies.  IV ceftriaxone initiated.  Patient will complete treatment with Keflex 500 mg every 6 hours for an additional 2 days.  Patient advised to wiping front to back, practice perianal hygiene, and continue hydration  Patient was therefore discharged home today in a hemodynamically stable condition.   Duwayne HeckDanielle will follow-up with PCP within 1 week of this discharge. Duwayne HeckDanielle was counseled extensively about nonpharmacologic means of pain management, patient verbalized understanding and was appreciative  of  the care received during this admission.   We discussed the need for good hydration, monitoring of hydration status, avoidance of heat, cold, stress, and infection triggers.  Continue folic acid 1 mg daily.  Patient was reminded of the need to  seek medical attention immediately if any symptom of bleeding, anemia, or infection occurs.  Discharge Exam: Vitals:   04/14/20 0538 04/14/20 0954  BP: (!) 155/75 131/69  Pulse: 81 69  Resp: 18 16  Temp: 99.1 F (37.3 C) 98.4 F (36.9 C)  SpO2: 100% 97%   Vitals:   04/13/20 2109 04/14/20 0131 04/14/20 0538 04/14/20 0954  BP: (!) 148/97 (!) 180/95 (!) 155/75 131/69  Pulse: 85 93 81 69  Resp: 18 18 18 16   Temp: 98.9 F (37.2 C) 98.5 F (36.9 C) 99.1 F (37.3 C) 98.4 F (36.9 C)  TempSrc: Oral Oral Oral Oral  SpO2: 98% 96% 100% 97%  Weight:      Height:        General appearance : Awake, alert, not in any distress. Speech Clear. Not toxic looking HEENT: Atraumatic and Normocephalic, pupils equally reactive to light and accomodation Neck: Supple, no JVD. No cervical lymphadenopathy.  Chest: Good air entry bilaterally, no added sounds  CVS: S1 S2 regular, no murmurs.  Abdomen: Bowel sounds present, Non tender and not distended with no guarding, rigidity or rebound. Extremities: B/L Lower Ext shows no edema, both legs are warm to touch Neurology: Awake alert, and oriented X 3, CN II-XII intact, Non focal Skin: No Rash  Discharge Instructions  Discharge Instructions    Discharge patient   Complete by: As directed    Discharge disposition: 01-Home or Self Care   Discharge patient date: 04/11/2020   Discharge patient   Complete by: As directed    Discharge disposition: 01-Home or Self Care   Discharge patient date: 04/14/2020     Allergies as of 04/14/2020      Reactions   Morphine And Related    nausea   Tylenol [acetaminophen]    nausea      Medication List    STOP taking these medications   hydroxyurea 500 MG capsule Commonly known as: HYDREA     TAKE these medications   cephALEXin 500 MG capsule Commonly known as: KEFLEX Take 1 capsule (500 mg total) by mouth every 6 (six) hours for 5 days.   folic acid 1 MG tablet Commonly known as: FOLVITE Take 1  tablet (1 mg total) by mouth daily.   ibuprofen 800 MG tablet Commonly known as: ADVIL Take 1 tablet (800 mg total) by mouth every 8 (eight) hours as needed. What changed:   medication strength  how much to take  when to take this  reasons to take this   Oxycodone HCl 10 MG Tabs Take 0.5 tablets (5 mg total) by mouth every 6 (six) hours as needed. What changed:   medication strength  when to take this       The results of significant diagnostics from this hospitalization (including imaging, microbiology, ancillary and laboratory) are listed below for reference.    Significant Diagnostic Studies: No results found.  Microbiology: Recent Results (from the past 240 hour(s))  Resp Panel by RT-PCR (Flu A&B, Covid) Nasopharyngeal Swab     Status: None   Collection Time: 04/08/20  4:35 AM   Specimen: Nasopharyngeal Swab; Nasopharyngeal(NP) swabs in vial transport medium  Result Value Ref Range Status   SARS Coronavirus 2 by RT PCR  NEGATIVE NEGATIVE Final    Comment: (NOTE) SARS-CoV-2 target nucleic acids are NOT DETECTED.  The SARS-CoV-2 RNA is generally detectable in upper respiratory specimens during the acute phase of infection. The lowest concentration of SARS-CoV-2 viral copies this assay can detect is 138 copies/mL. A negative result does not preclude SARS-Cov-2 infection and should not be used as the sole basis for treatment or other patient management decisions. A negative result may occur with  improper specimen collection/handling, submission of specimen other than nasopharyngeal swab, presence of viral mutation(s) within the areas targeted by this assay, and inadequate number of viral copies(<138 copies/mL). A negative result must be combined with clinical observations, patient history, and epidemiological information. The expected result is Negative.  Fact Sheet for Patients:  BloggerCourse.com  Fact Sheet for Healthcare Providers:   SeriousBroker.it  This test is no t yet approved or cleared by the [place] FDA and  has been authorized for detection and/or diagnosis of SARS-CoV-2 by FDA under an Emergency Use Authorization (EUA). This EUA will remain  in effect (meaning this test can be used) for the duration of the COVID-19 declaration under Section 564(b)(1) of the Act, 21 U.S.C.section 360bbb-3(b)(1), unless the authorization is terminated  or revoked sooner.       Influenza A by PCR NEGATIVE NEGATIVE Final   Influenza B by PCR NEGATIVE NEGATIVE Final    Comment: (NOTE) The Xpert Xpress SARS-CoV-2/FLU/RSV plus assay is intended as an aid in the diagnosis of influenza from Nasopharyngeal swab specimens and should not be used as a sole basis for treatment. Nasal washings and aspirates are unacceptable for Xpert Xpress SARS-CoV-2/FLU/RSV testing.  Fact Sheet for Patients: BloggerCourse.com  Fact Sheet for Healthcare Providers: SeriousBroker.it  This test is not yet approved or cleared by the [place] FDA and has been authorized for detection and/or diagnosis of SARS-CoV-2 by FDA under an Emergency Use Authorization (EUA). This EUA will remain in effect (meaning this test can be used) for the duration of the COVID-19 declaration under Section 564(b)(1) of the Act, 21 U.S.C. section 360bbb-3(b)(1), unless the authorization is terminated or revoked.  Performed at [facility], [address], [place], [place] [address]   Culture, Urine     Status: Abnormal   Collection Time: 04/09/20  3:40 AM   Specimen: Urine, Random  Result Value Ref Range Status   Specimen Description   Final    URINE, RANDOM Performed at [facility], [address] [place], [place] [address]    Special Requests   Final    NONE Performed at [facility], [address] [place],  [place] [address]    Culture >=100,000 COLONIES/mL ESCHERICHIA COLI (A)  Final   Report Status 04/11/2020 FINAL  Final   Organism ID, Bacteria ESCHERICHIA COLI (A)  Final      Susceptibility   Escherichia coli - MIC*    AMPICILLIN 4 SENSITIVE Sensitive     CEFAZOLIN <=4 SENSITIVE Sensitive     CEFEPIME <=0.12 SENSITIVE Sensitive     CEFTRIAXONE <=0.25 SENSITIVE Sensitive     CIPROFLOXACIN <=0.25 SENSITIVE Sensitive     GENTAMICIN <=1 SENSITIVE Sensitive     IMIPENEM <=0.25 SENSITIVE Sensitive     NITROFURANTOIN <=16 SENSITIVE Sensitive     TRIMETH/SULFA <=20 SENSITIVE Sensitive     AMPICILLIN/SULBACTAM <=2 SENSITIVE Sensitive     PIP/TAZO <=4 SENSITIVE Sensitive     * >=100,000 COLONIES/mL ESCHERICHIA COLI     Labs: Basic Metabolic  Panel: Recent Labs  Lab 04/07/20 2348 04/12/20 1056 04/13/20 1314  NA 139 141 138  K 3.7 2.9* 3.7  CL 108 104 105  CO2 22 27 25   GLUCOSE 106* 103* 94  BUN 7 <5* <5*  CREATININE 0.30* <0.30* <0.30*  CALCIUM 9.4 9.5 9.1   Liver Function Tests: Recent Labs  Lab 04/07/20 2348  AST 61*  ALT 24  ALKPHOS 96  BILITOT 3.8*  PROT 8.2*  ALBUMIN 4.7   No results for input(s): LIPASE, AMYLASE in the last 168 hours. No results for input(s): AMMONIA in the last 168 hours. CBC: Recent Labs  Lab 04/07/20 2348 04/09/20 0529 04/10/20 0532 04/12/20 1056 04/14/20 0705  WBC 12.5* 9.1 9.7 7.3 8.8  NEUTROABS 6.6  --   --   --  5.4  HGB 8.8* 8.8* 8.1* 8.8* 8.3*  HCT 23.6* 23.9* 22.9* 24.8* 23.8*  MCV 97.1 97.6 100.9* 100.4* 100.8*  PLT 288 260 221 290 243   Cardiac Enzymes: No results for input(s): CKTOTAL, CKMB, CKMBINDEX, TROPONINI in the last 168 hours. BNP: Invalid input(s): POCBNP CBG: No results for input(s): GLUCAP in the last 168 hours.  Time coordinating discharge: 35 minutes  Signed:  04/16/20  APRN, MSN, FNP-C Patient Care [facility] [address] [place], [place] [phone]  [facility] 04/14/2020, 1:27 PM

## 2020-06-10 ENCOUNTER — Emergency Department (HOSPITAL_BASED_OUTPATIENT_CLINIC_OR_DEPARTMENT_OTHER): Payer: Federal, State, Local not specified - PPO | Admitting: Radiology

## 2020-06-10 ENCOUNTER — Inpatient Hospital Stay (HOSPITAL_BASED_OUTPATIENT_CLINIC_OR_DEPARTMENT_OTHER)
Admission: EM | Admit: 2020-06-10 | Discharge: 2020-06-15 | DRG: 811 | Disposition: A | Discharge status: Home / Self Care | Payer: Federal, State, Local not specified - PPO | Attending: Internal Medicine | Admitting: Internal Medicine

## 2020-06-10 ENCOUNTER — Other Ambulatory Visit: Payer: Self-pay

## 2020-06-10 ENCOUNTER — Encounter (HOSPITAL_BASED_OUTPATIENT_CLINIC_OR_DEPARTMENT_OTHER): Payer: Self-pay | Admitting: Emergency Medicine

## 2020-06-10 DIAGNOSIS — Z87891 Personal history of nicotine dependence: Secondary | ICD-10-CM

## 2020-06-10 DIAGNOSIS — Z79899 Other long term (current) drug therapy: Secondary | ICD-10-CM

## 2020-06-10 DIAGNOSIS — J189 Pneumonia, unspecified organism: Secondary | ICD-10-CM

## 2020-06-10 DIAGNOSIS — M546 Pain in thoracic spine: Secondary | ICD-10-CM | POA: Diagnosis present

## 2020-06-10 DIAGNOSIS — Z20822 Contact with and (suspected) exposure to covid-19: Secondary | ICD-10-CM | POA: Diagnosis present

## 2020-06-10 DIAGNOSIS — E876 Hypokalemia: Secondary | ICD-10-CM | POA: Diagnosis present

## 2020-06-10 DIAGNOSIS — Z885 Allergy status to narcotic agent status: Secondary | ICD-10-CM

## 2020-06-10 DIAGNOSIS — D5700 Hb-SS disease with crisis, unspecified: Secondary | ICD-10-CM | POA: Diagnosis not present

## 2020-06-10 DIAGNOSIS — Z886 Allergy status to analgesic agent status: Secondary | ICD-10-CM

## 2020-06-10 LAB — CBC WITH DIFFERENTIAL/PLATELET
Abs Immature Granulocytes: 0.35 10*3/uL — ABNORMAL HIGH (ref 0.00–0.07)
Basophils Absolute: 0.1 10*3/uL (ref 0.0–0.1)
Basophils Relative: 0 %
Eosinophils Absolute: 0 10*3/uL (ref 0.0–0.5)
Eosinophils Relative: 0 %
HCT: 21.9 % — ABNORMAL LOW (ref 36.0–46.0)
Hemoglobin: 7.9 g/dL — ABNORMAL LOW (ref 12.0–15.0)
Immature Granulocytes: 2 %
Lymphocytes Relative: 22 %
Lymphs Abs: 3.2 10*3/uL (ref 0.7–4.0)
MCH: 31.6 pg (ref 26.0–34.0)
MCHC: 36.1 g/dL — ABNORMAL HIGH (ref 30.0–36.0)
MCV: 87.6 fL (ref 80.0–100.0)
Monocytes Absolute: 1.3 10*3/uL — ABNORMAL HIGH (ref 0.1–1.0)
Monocytes Relative: 9 %
Neutro Abs: 9.6 10*3/uL — ABNORMAL HIGH (ref 1.7–7.7)
Neutrophils Relative %: 67 %
Platelets: 285 10*3/uL (ref 150–400)
RBC: 2.5 MIL/uL — ABNORMAL LOW (ref 3.87–5.11)
RDW: 24.2 % — ABNORMAL HIGH (ref 11.5–15.5)
WBC: 14.5 10*3/uL — ABNORMAL HIGH (ref 4.0–10.5)
nRBC: 2.5 % — ABNORMAL HIGH (ref 0.0–0.2)

## 2020-06-10 LAB — COMPREHENSIVE METABOLIC PANEL
ALT: 33 U/L (ref 0–44)
AST: 57 U/L — ABNORMAL HIGH (ref 15–41)
Albumin: 4.7 g/dL (ref 3.5–5.0)
Alkaline Phosphatase: 125 U/L (ref 38–126)
Anion gap: 13 (ref 5–15)
BUN: 7 mg/dL (ref 6–20)
CO2: 21 mmol/L — ABNORMAL LOW (ref 22–32)
Calcium: 9.5 mg/dL (ref 8.9–10.3)
Chloride: 102 mmol/L (ref 98–111)
Creatinine, Ser: 0.48 mg/dL (ref 0.44–1.00)
GFR, Estimated: >60 mL/min (ref >60)
Glucose, Bld: 113 mg/dL — ABNORMAL HIGH (ref 70–99)
Potassium: 3.6 mmol/L (ref 3.5–5.1)
Sodium: 136 mmol/L (ref 135–145)
Total Bilirubin: 6.5 mg/dL — ABNORMAL HIGH (ref 0.3–1.2)
Total Protein: 8.1 g/dL (ref 6.5–8.1)

## 2020-06-10 LAB — RETICULOCYTES
Immature Retic Fract: 38.2 % — ABNORMAL HIGH (ref 2.3–15.9)
RBC.: 2.75 MIL/uL — ABNORMAL LOW (ref 3.87–5.11)
Retic Count, Absolute: 184 10*3/uL (ref 19.0–186.0)
Retic Ct Pct: 6.7 % — ABNORMAL HIGH (ref 0.4–3.1)

## 2020-06-10 MED ORDER — SODIUM CHLORIDE 0.9 % IV SOLN
1.0000 g | Freq: Once | INTRAVENOUS | Status: AC
  Administered 2020-06-10: 1 g via INTRAVENOUS
  Filled 2020-06-10: qty 10

## 2020-06-10 MED ORDER — SODIUM CHLORIDE 0.9 % IV SOLN
25.0000 mg | INTRAVENOUS | Status: DC | PRN
  Administered 2020-06-10: 25 mg via INTRAVENOUS
  Filled 2020-06-10: qty 1

## 2020-06-10 MED ORDER — ONDANSETRON HCL 4 MG/2ML IJ SOLN
4.0000 mg | Freq: Once | INTRAMUSCULAR | Status: AC
  Administered 2020-06-10: 4 mg via INTRAVENOUS
  Filled 2020-06-10: qty 2

## 2020-06-10 MED ORDER — HYDROMORPHONE HCL 1 MG/ML IJ SOLN
2.0000 mg | INTRAMUSCULAR | Status: AC | PRN
  Administered 2020-06-10 – 2020-06-11 (×3): 2 mg via INTRAVENOUS
  Filled 2020-06-10 (×3): qty 2

## 2020-06-10 MED ORDER — PROMETHAZINE HCL 25 MG/ML IJ SOLN
INTRAMUSCULAR | Status: AC
  Filled 2020-06-10: qty 1

## 2020-06-10 MED ORDER — DOXYCYCLINE HYCLATE 100 MG PO TABS
100.0000 mg | ORAL_TABLET | Freq: Once | ORAL | Status: AC
  Administered 2020-06-10: 100 mg via ORAL
  Filled 2020-06-10: qty 1

## 2020-06-10 MED ORDER — SODIUM CHLORIDE 0.9 % IV SOLN: Freq: Once | INTRAVENOUS | Status: AC

## 2020-06-10 NOTE — ED Notes (Signed)
Pt. currently on Dilaudid drip for SS(Acute Chest)Crisis, placed on 3 lpm n/c initially to help keep oxygen saturations >92%, transient desaturations continued with pt. removing n/c-RT placed EtC02 monitor on pt. with 3 lpm oxygen, RN made aware.

## 2020-06-10 NOTE — ED Notes (Signed)
Pt's oxygen decreased to 1L/min, SpO2 100%

## 2020-06-10 NOTE — ED Provider Notes (Signed)
[facility] EMERGENCY DEPT Provider Note   CSN: [id number] Arrival date & time: 06/10/20  1625     History Chief Complaint  Patient presents with  . Sickle Cell Pain Crisis    Brooke Patterson is a 20 y.o. female.  The history is provided by the patient.  Sickle Cell Pain Crisis Location:  Back and chest Severity:  Severe Onset quality:  Gradual Duration:  1 day Similar to previous crisis episodes: yes   Timing:  Constant Progression:  Worsening Chronicity:  New History of pulmonary emboli: no   Context: not change in medication and not infection   Relieved by:  Nothing Worsened by:  Movement Ineffective treatments:  OTC medications and prescription drugs (ibuprofen and oxycodone) Associated symptoms: nausea and vomiting   Associated symptoms: no chest pain, no cough, no fever, no shortness of breath, no sore throat and no swelling of legs   Risk factors comment:  Admitted 3/22. Did not obtain hematology f/u.      Past Medical History:  Diagnosis Date  . Sickle cell anemia [facility])     Patient Active Problem List   Diagnosis Date Noted  . E-coli UTI 04/11/2020  . Acute cystitis without hematuria   . Sickle cell pain crisis (HCC) 04/08/2020    History reviewed. No pertinent surgical history.   OB History   No obstetric history on file.     No family history on file.  Social History   Tobacco Use  . Smoking status: Current Every Day Smoker  . Smokeless tobacco: Never Used  Substance Use Topics  . Alcohol use: Yes  . Drug use: Yes    Types: Marijuana    Home Medications Prior to Admission medications   Medication Sig Start Date End Date Taking? Authorizing Provider  ibuprofen (ADVIL) 800 MG tablet Take 1 tablet (800 mg total) by mouth every 8 (eight) hours as needed. 04/11/20  Yes Massie Maroon, FNP  oxyCODONE 10 MG TABS Take 0.5 tablets (5 mg total) by mouth every 6 (six) hours as needed. 04/11/20  Yes Massie Maroon, FNP  folic  acid (FOLVITE) 1 MG tablet Take 1 tablet (1 mg total) by mouth daily. 04/11/20 04/11/21  Massie Maroon, FNP    Allergies    Morphine and related and Tylenol [acetaminophen]  Review of Systems   Review of Systems  Constitutional: Negative for chills and fever.  HENT: Negative for ear pain and sore throat.   Eyes: Negative for pain and visual disturbance.  Respiratory: Negative for cough and shortness of breath.   Cardiovascular: Negative for chest pain and palpitations.  Gastrointestinal: Positive for nausea and vomiting. Negative for abdominal pain.  Genitourinary: Negative for dysuria and hematuria.  Musculoskeletal: Negative for arthralgias and back pain.  Skin: Negative for color change and rash.  Neurological: Negative for seizures and syncope.  All other systems reviewed and are negative.   Physical Exam Updated Vital Signs BP (!) 137/92 (BP Location: Right Arm)   Pulse (!) 108   Temp 98.8 F (37.1 C)   Resp 20   Ht 5\' 6"  (1.676 m)   Wt 63.5 kg   SpO2 92%   BMI 22.60 kg/m   Physical Exam Vitals and nursing note reviewed.  Constitutional:      General: She is not in acute distress.    Appearance: She is well-developed.  HENT:     Head: Normocephalic and atraumatic.  Eyes:     Conjunctiva/sclera: Conjunctivae normal.  Cardiovascular:  Rate and Rhythm: Regular rhythm. Tachycardia present.     Heart sounds: No murmur heard.   Pulmonary:     Effort: Pulmonary effort is normal. No respiratory distress.     Breath sounds: Normal breath sounds.  Abdominal:     Palpations: Abdomen is soft.     Tenderness: There is no abdominal tenderness.  Musculoskeletal:     Cervical back: Neck supple.  Skin:    General: Skin is warm and dry.  Neurological:     General: No focal deficit present.     Mental Status: She is alert.  Psychiatric:        Mood and Affect: Mood normal.     ED Results / Procedures / Treatments   Labs (all labs ordered are listed, but only  abnormal results are displayed) Labs Reviewed  CBC WITH DIFFERENTIAL/PLATELET - Abnormal; Notable for the following components:      Result Value   WBC 14.5 (*)    RBC 2.50 (*)    Hemoglobin 7.9 (*)    HCT 21.9 (*)    MCHC 36.1 (*)    RDW 24.2 (*)    nRBC 2.5 (*)    Neutro Abs 9.6 (*)    Monocytes Absolute 1.3 (*)    Abs Immature Granulocytes 0.35 (*)    All other components within normal limits  RETICULOCYTES - Abnormal; Notable for the following components:   Retic Ct Pct 6.7 (*)    RBC. 2.75 (*)    Immature Retic Fract 38.2 (*)    All other components within normal limits  COMPREHENSIVE METABOLIC PANEL - Abnormal; Notable for the following components:   CO2 21 (*)    Glucose, Bld 113 (*)    AST 57 (*)    Total Bilirubin 6.5 (*)    All other components within normal limits  CULTURE, BLOOD (ROUTINE X 2)  CULTURE, BLOOD (ROUTINE X 2)    EKG None  Radiology DG Chest 2 View  Result Date: 06/10/2020 CLINICAL DATA:  Sickle cell crisis, posterior chest/rib pain in the mid back, current smoker EXAM: CHEST - 2 VIEW COMPARISON:  None. FINDINGS: Coarsened interstitial changes may reflect chronic lung sequela in a sickle cell patient. Some more patchy and bandlike opacities present along the left hemidiaphragm which could reflect scarring versus an acute airspace process. No other focal airspace disease. No pneumothorax or visible effusion. No mediastinal fluid or gas. Normal thyroid gland and thoracic inlet. No acute abnormality of the trachea or esophagus. No worrisome mediastinal, hilar or axillary adenopathy. Mild osseous manifestations of sickle cell disease. IMPRESSION: Opacity in the left lung base could reflect scarring or more acute airspace process of in a patient with history of sickle cell and acute onset chest pain. Chronically coarsened interstitial changes. Mild osseous manifestations of sickle cell. Electronically Signed   By: Kreg Shropshire M.D.   On: 06/10/2020 17:18     Procedures Procedures   Medications Ordered in ED Medications  0.9 %  sodium chloride infusion (has no administration in time range)  HYDROmorphone (DILAUDID) injection 2 mg (has no administration in time range)  promethazine (PHENERGAN) 25 mg in sodium chloride 0.9 % 50 mL IVPB (has no administration in time range)    ED Course  I have reviewed the triage vital signs and the nursing notes.  Pertinent labs & imaging results that were available during my care of the patient were reviewed by me and considered in my medical decision making (see chart for details).  Clinical Course as of 06/10/20 2236  Tue Jun 10, 2020  2234 I spoke with Dr. Arville Care who will accept the patient. [AW]    Clinical Course User Index [AW] Koleen Distance, MD   MDM Rules/Calculators/A&P                          Brooke Patterson presented with sickle cell pain.  She was complaining of pain in her thorax.  During her ED course, she had oxygen desaturation which necessitated her being placed on 3 L of oxygen until she was weaned to 1 L.  Initially I thought this was likely due to narcotic administration, but her chest x-ray shows possible pneumonia.  Her white blood cell count is also slightly elevated.  It was difficult to continue giving her pain medication to treat her pain without dropping her oxygen saturation, and I was concerned about the possibility that pneumonia could be contributing to her lower oxygen saturations.  Therefore, I recommended she be admitted to the hospital. Final Clinical Impression(s) / ED Diagnoses Final diagnoses:  Sickle cell pain crisis (HCC)  Community acquired pneumonia of left lower lobe of lung    Rx / DC Orders ED Discharge Orders    None       Koleen Distance, MD 06/10/20 2237

## 2020-06-10 NOTE — ED Triage Notes (Signed)
sicle cell pain x 2 hours took motrin  800 mg at 11 and an oxy at 1130  Has not helped  Having some nausea

## 2020-06-11 ENCOUNTER — Encounter (HOSPITAL_COMMUNITY): Payer: Self-pay | Admitting: Family Medicine

## 2020-06-11 DIAGNOSIS — J189 Pneumonia, unspecified organism: Secondary | ICD-10-CM

## 2020-06-11 DIAGNOSIS — M546 Pain in thoracic spine: Secondary | ICD-10-CM | POA: Diagnosis present

## 2020-06-11 DIAGNOSIS — Z87891 Personal history of nicotine dependence: Secondary | ICD-10-CM | POA: Diagnosis not present

## 2020-06-11 DIAGNOSIS — Z886 Allergy status to analgesic agent status: Secondary | ICD-10-CM | POA: Diagnosis not present

## 2020-06-11 DIAGNOSIS — Z79899 Other long term (current) drug therapy: Secondary | ICD-10-CM | POA: Diagnosis not present

## 2020-06-11 DIAGNOSIS — E876 Hypokalemia: Secondary | ICD-10-CM | POA: Diagnosis present

## 2020-06-11 DIAGNOSIS — D5700 Hb-SS disease with crisis, unspecified: Secondary | ICD-10-CM | POA: Diagnosis present

## 2020-06-11 DIAGNOSIS — Z20822 Contact with and (suspected) exposure to covid-19: Secondary | ICD-10-CM | POA: Diagnosis present

## 2020-06-11 DIAGNOSIS — Z885 Allergy status to narcotic agent status: Secondary | ICD-10-CM | POA: Diagnosis not present

## 2020-06-11 LAB — CBC WITH DIFFERENTIAL/PLATELET
Abs Immature Granulocytes: 0.11 10*3/uL — ABNORMAL HIGH (ref 0.00–0.07)
Basophils Absolute: 0.1 10*3/uL (ref 0.0–0.1)
Basophils Relative: 0 %
Eosinophils Absolute: 0 10*3/uL (ref 0.0–0.5)
Eosinophils Relative: 0 %
HCT: 19.5 % — ABNORMAL LOW (ref 36.0–46.0)
Hemoglobin: 7 g/dL — ABNORMAL LOW (ref 12.0–15.0)
Immature Granulocytes: 1 %
Lymphocytes Relative: 16 %
Lymphs Abs: 2 10*3/uL (ref 0.7–4.0)
MCH: 31.4 pg (ref 26.0–34.0)
MCHC: 35.9 g/dL (ref 30.0–36.0)
MCV: 87.4 fL (ref 80.0–100.0)
Monocytes Absolute: 1.4 10*3/uL — ABNORMAL HIGH (ref 0.1–1.0)
Monocytes Relative: 12 %
Neutro Abs: 8.4 10*3/uL — ABNORMAL HIGH (ref 1.7–7.7)
Neutrophils Relative %: 71 %
Platelets: 286 10*3/uL (ref 150–400)
RBC: 2.23 MIL/uL — ABNORMAL LOW (ref 3.87–5.11)
RDW: 23.9 % — ABNORMAL HIGH (ref 11.5–15.5)
WBC: 12 10*3/uL — ABNORMAL HIGH (ref 4.0–10.5)
nRBC: 2.6 % — ABNORMAL HIGH (ref 0.0–0.2)

## 2020-06-11 LAB — COMPREHENSIVE METABOLIC PANEL
ALT: 31 U/L (ref 0–44)
AST: 51 U/L — ABNORMAL HIGH (ref 15–41)
Albumin: 4.1 g/dL (ref 3.5–5.0)
Alkaline Phosphatase: 108 U/L (ref 38–126)
Anion gap: 8 (ref 5–15)
BUN: <5 mg/dL — ABNORMAL LOW (ref 6–20)
CO2: 23 mmol/L (ref 22–32)
Calcium: 8.8 mg/dL — ABNORMAL LOW (ref 8.9–10.3)
Chloride: 107 mmol/L (ref 98–111)
Creatinine, Ser: <0.3 mg/dL — ABNORMAL LOW (ref 0.44–1.00)
Glucose, Bld: 111 mg/dL — ABNORMAL HIGH (ref 70–99)
Potassium: 3.3 mmol/L — ABNORMAL LOW (ref 3.5–5.1)
Sodium: 138 mmol/L (ref 135–145)
Total Bilirubin: 3.4 mg/dL — ABNORMAL HIGH (ref 0.3–1.2)
Total Protein: 7.7 g/dL (ref 6.5–8.1)

## 2020-06-11 LAB — HIV ANTIBODY (ROUTINE TESTING W REFLEX): HIV Screen 4th Generation wRfx: NONREACTIVE

## 2020-06-11 LAB — RESP PANEL BY RT-PCR (FLU A&B, COVID) ARPGX2
Influenza A by PCR: NEGATIVE
Influenza B by PCR: NEGATIVE
SARS Coronavirus 2 by RT PCR: NEGATIVE

## 2020-06-11 MED ORDER — SODIUM CHLORIDE 0.9 % IV SOLN
12.5000 mg | Freq: Once | INTRAVENOUS | Status: DC
  Filled 2020-06-11: qty 0.5

## 2020-06-11 MED ORDER — POLYETHYLENE GLYCOL 3350 17 G PO PACK: 17.0000 g | PACK | Freq: Every day | ORAL | Status: DC | PRN

## 2020-06-11 MED ORDER — SODIUM CHLORIDE 0.9 % IV SOLN
500.0000 mg | INTRAVENOUS | Status: DC
  Administered 2020-06-11 – 2020-06-13 (×3): 500 mg via INTRAVENOUS
  Filled 2020-06-11 (×3): qty 500

## 2020-06-11 MED ORDER — HYDROMORPHONE 1 MG/ML IV SOLN
INTRAVENOUS | Status: DC
Start: 2020-06-11 — End: 2020-06-15
  Administered 2020-06-11: 0.9 mg via INTRAVENOUS
  Administered 2020-06-11: 2.7 mg via INTRAVENOUS
  Administered 2020-06-11: 0.9 mg via INTRAVENOUS
  Administered 2020-06-11: 0.8 mg via INTRAVENOUS
  Administered 2020-06-11: 1.5 mg via INTRAVENOUS
  Administered 2020-06-12: 0.3 mg via INTRAVENOUS
  Administered 2020-06-12: 1.8 mg via INTRAVENOUS
  Administered 2020-06-12: 2.7 mg via INTRAVENOUS
  Administered 2020-06-12: 1.5 mg via INTRAVENOUS
  Administered 2020-06-12: 1.2 mg via INTRAVENOUS
  Administered 2020-06-12: 2.4 mg via INTRAVENOUS
  Administered 2020-06-13: 1.5 mg via INTRAVENOUS
  Administered 2020-06-13: 30 mg via INTRAVENOUS
  Administered 2020-06-13: 0.6 mg via INTRAVENOUS
  Administered 2020-06-13: 1.5 mg via INTRAVENOUS
  Administered 2020-06-13: 3.3 mg via INTRAVENOUS
  Administered 2020-06-13: 1.8 mg via INTRAVENOUS
  Administered 2020-06-14: 0.3 mg via INTRAVENOUS
  Administered 2020-06-14 (×2): 2.1 mg via INTRAVENOUS
  Administered 2020-06-14: 3 mg via INTRAVENOUS
  Administered 2020-06-15: 1.5 mg via INTRAVENOUS
  Administered 2020-06-15: 1.2 mg via INTRAVENOUS
  Filled 2020-06-11 (×2): qty 30

## 2020-06-11 MED ORDER — SODIUM CHLORIDE 0.9 % IV SOLN
2.0000 g | INTRAVENOUS | Status: DC
  Administered 2020-06-11 – 2020-06-12 (×2): 2 g via INTRAVENOUS
  Filled 2020-06-11 (×2): qty 20

## 2020-06-11 MED ORDER — SENNOSIDES-DOCUSATE SODIUM 8.6-50 MG PO TABS
1.0000 | ORAL_TABLET | Freq: Two times a day (BID) | ORAL | Status: DC
  Administered 2020-06-11 – 2020-06-15 (×9): 1 via ORAL
  Filled 2020-06-11 (×9): qty 1

## 2020-06-11 MED ORDER — KETOROLAC TROMETHAMINE 15 MG/ML IJ SOLN
15.0000 mg | Freq: Four times a day (QID) | INTRAMUSCULAR | Status: AC
  Administered 2020-06-11 – 2020-06-12 (×5): 15 mg via INTRAVENOUS
  Filled 2020-06-11 (×5): qty 1

## 2020-06-11 MED ORDER — SODIUM CHLORIDE 0.45 % IV SOLN
INTRAVENOUS | Status: DC
Start: 2020-06-11 — End: 2020-06-12

## 2020-06-11 MED ORDER — NALOXONE HCL 0.4 MG/ML IJ SOLN: 0.4000 mg | INTRAMUSCULAR | Status: DC | PRN

## 2020-06-11 MED ORDER — SODIUM CHLORIDE 0.9% FLUSH: 9.0000 mL | INTRAVENOUS | Status: DC | PRN

## 2020-06-11 MED ORDER — ENOXAPARIN SODIUM 40 MG/0.4ML IJ SOSY
40.0000 mg | PREFILLED_SYRINGE | INTRAMUSCULAR | Status: DC
  Administered 2020-06-11 – 2020-06-15 (×5): 40 mg via SUBCUTANEOUS
  Filled 2020-06-11 (×5): qty 0.4

## 2020-06-11 MED ORDER — ONDANSETRON HCL 4 MG/2ML IJ SOLN
4.0000 mg | Freq: Four times a day (QID) | INTRAMUSCULAR | Status: DC | PRN
  Administered 2020-06-11 – 2020-06-12 (×4): 4 mg via INTRAVENOUS
  Filled 2020-06-11 (×4): qty 2

## 2020-06-11 NOTE — ED Notes (Signed)
Report given to [facility] 

## 2020-06-11 NOTE — Progress Notes (Signed)
Pt arrived via [facility] from [facility]. Rating pain 5/10 in back and legs. Requires O2 1L via [place] to maintain sat >90%. Paged admits/consults to inform them that pt was here. Awaiting admission orders. Continue to monitor. Mick Sell RN.

## 2020-06-11 NOTE — H&P (Signed)
History and Physical    Brooke Patterson ZCH:[id number] DOB: 2000-08-02 DOA: 06/10/2020  PCP: Willey Blade, MD  Patient coming from: Home.  Chief Complaint: Pain.  HPI: Brooke Patterson is a 20 y.o. female with history of sickle cell anemia presents to the ER with complaint of upper back pain and left-sided pleuritic chest pain.  Patient also has Productive cough for the last 24 hours.  Denies any fever chills.  ED Course: In the ER patient continued to have pain despite pain relief medications.  Chest x-ray was showing opacity concerning for pneumonia.  Labs are at baseline.  Did show some leukocytosis.  Given the productive cough and opacity in the lung empiric antibiotics were started for pneumonia.  Patient admitted for sickle cell pain crisis and pneumonia.  Review of Systems: As per HPI, rest all negative.   Past Medical History:  Diagnosis Date  . Sickle cell anemia (HCC)     History reviewed. No pertinent surgical history.   reports that she has been smoking. She has never used smokeless tobacco. She reports current alcohol use. She reports current drug use. Drug: Marijuana.  Allergies  Allergen Reactions  . Acetaminophen Nausea And Vomiting    nausea Other reaction(s): Vomiting  . Morphine And Related     nausea    Family History  Family history unknown: Yes    Prior to Admission medications   Medication Sig Start Date End Date Taking? Authorizing Provider  escitalopram (LEXAPRO) 5 MG tablet Take 5 mg by mouth daily. 06/06/20  Yes [provider]  ibuprofen (ADVIL) 800 MG tablet Take 1 tablet (800 mg total) by mouth every 8 (eight) hours as needed. Patient taking differently: Take 800 mg by mouth every 8 (eight) hours as needed for mild pain. 04/11/20  Yes Massie Maroon, FNP  oxyCODONE 10 MG TABS Take 0.5 tablets (5 mg total) by mouth every 6 (six) hours as needed. Patient taking differently: Take 5 mg by mouth every 6 (six) hours as needed (pain).  04/11/20  Yes Massie Maroon, FNP  folic acid (FOLVITE) 1 MG tablet Take 1 tablet (1 mg total) by mouth daily. Patient not taking: Reported on 06/11/2020 04/11/20 04/11/21  Massie Maroon, FNP    Physical Exam: Constitutional: Moderately built and nourished. Vitals:   06/10/20 2115 06/10/20 2200 06/11/20 0000 06/11/20 0104  BP: 100/60 (!) 106/58 112/72 125/81  Pulse: 75 79  81  Resp: 10 15  18   Temp:    98.4 F (36.9 C)  TempSrc:    Oral  SpO2: 100% 100%  93%  Weight:    64.2 kg  Height:       Eyes: Anicteric no pallor. ENMT: No discharge from the ears eyes nose and mouth: Neck: No mass felt.  No neck rigidity. Respiratory: No rhonchi or crepitations. Cardiovascular: S1 S2 heard. Abdomen: Soft nontender bowel sounds present. Musculoskeletal: No edema. Skin: No rash. Neurologic: Alert awake oriented to time place and person.  Moves all extremities. Psychiatric: Appears normal.  Normal affect.   Labs on Admission: I have personally reviewed following labs and imaging studies  CBC: Recent Labs  Lab 06/10/20 1655  WBC 14.5*  NEUTROABS 9.6*  HGB 7.9*  HCT 21.9*  MCV 87.6  PLT 285   Basic Metabolic Panel: Recent Labs  Lab 06/10/20 1655  NA 136  K 3.6  CL 102  CO2 21*  GLUCOSE 113*  BUN 7  CREATININE 0.48  CALCIUM 9.5   GFR: Estimated  Creatinine Clearance: 104.1 mL/min (by C-G formula based on SCr of 0.48 mg/dL). Liver Function Tests: Recent Labs  Lab 06/10/20 1655  AST 57*  ALT 33  ALKPHOS 125  BILITOT 6.5*  PROT 8.1  ALBUMIN 4.7   No results for input(s): LIPASE, AMYLASE in the last 168 hours. No results for input(s): AMMONIA in the last 168 hours. Coagulation Profile: No results for input(s): INR, PROTIME in the last 168 hours. Cardiac Enzymes: No results for input(s): CKTOTAL, CKMB, CKMBINDEX, TROPONINI in the last 168 hours. BNP (last 3 results) No results for input(s): PROBNP in the last 8760 hours. HbA1C: No results for input(s): HGBA1C  in the last 72 hours. CBG: No results for input(s): GLUCAP in the last 168 hours. Lipid Profile: No results for input(s): CHOL, HDL, LDLCALC, TRIG, CHOLHDL, LDLDIRECT in the last 72 hours. Thyroid Function Tests: No results for input(s): TSH, T4TOTAL, FREET4, T3FREE, THYROIDAB in the last 72 hours. Anemia Panel: Recent Labs    06/10/20 1655  RETICCTPCT 6.7*   Urine analysis:    Component Value Date/Time   COLORURINE YELLOW 04/09/2020 0340   APPEARANCEUR CLOUDY (A) 04/09/2020 0340   LABSPEC 1.009 04/09/2020 0340   PHURINE 6.0 04/09/2020 0340   GLUCOSEU NEGATIVE 04/09/2020 0340   HGBUR MODERATE (A) 04/09/2020 0340   BILIRUBINUR NEGATIVE 04/09/2020 0340   KETONESUR 80 (A) 04/09/2020 0340   PROTEINUR 30 (A) 04/09/2020 0340   NITRITE NEGATIVE 04/09/2020 0340   LEUKOCYTESUR LARGE (A) 04/09/2020 0340   Sepsis Labs: @LABRCNTIP (procalcitonin:4,lacticidven:4) ) Recent Results (from the past 240 hour(s))  Resp Panel by RT-PCR (Flu A&B, Covid) Nasopharyngeal Swab     Status: None   Collection Time: 06/10/20 11:01 PM   Specimen: Nasopharyngeal Swab; Nasopharyngeal(NP) swabs in vial transport medium  Result Value Ref Range Status   SARS Coronavirus 2 by RT PCR NEGATIVE NEGATIVE Final    Comment: (NOTE) SARS-CoV-2 target nucleic acids are NOT DETECTED.  The SARS-CoV-2 RNA is generally detectable in upper respiratory specimens during the acute phase of infection. The lowest concentration of SARS-CoV-2 viral copies this assay can detect is 138 copies/mL. A negative result does not preclude SARS-Cov-2 infection and should not be used as the sole basis for treatment or other patient management decisions. A negative result may occur with  improper specimen collection/handling, submission of specimen other than nasopharyngeal swab, presence of viral mutation(s) within the areas targeted by this assay, and inadequate number of viral copies(<138 copies/mL). A negative result must be  combined with clinical observations, patient history, and epidemiological information. The expected result is Negative.  Fact Sheet for Patients:  06/12/20  Fact Sheet for Healthcare Providers:  BloggerCourse.com  This test is no t yet approved or cleared by the [place] FDA and  has been authorized for detection and/or diagnosis of SARS-CoV-2 by FDA under an Emergency Use Authorization (EUA). This EUA will remain  in effect (meaning this test can be used) for the duration of the COVID-19 declaration under Section 564(b)(1) of the Act, 21 U.S.C.section 360bbb-3(b)(1), unless the authorization is terminated  or revoked sooner.       Influenza A by PCR NEGATIVE NEGATIVE Final   Influenza B by PCR NEGATIVE NEGATIVE Final    Comment: (NOTE) The Xpert Xpress SARS-CoV-2/FLU/RSV plus assay is intended as an aid in the diagnosis of influenza from Nasopharyngeal swab specimens and should not be used as a sole basis for treatment. Nasal washings and aspirates are unacceptable for Xpert Xpress SARS-CoV-2/FLU/RSV testing.  Fact Sheet  for Patients: BloggerCourse.com  Fact Sheet for Healthcare Providers: [employer]ious[employer]ker.it  This test is not yet approved or cleared by the [place] FDA and has been authorized for detection and/or diagnosis of SARS-CoV-2 by FDA under an Emergency Use Authorization (EUA). This EUA will remain in effect (meaning this test can be used) for the duration of the COVID-19 declaration under Section 564(b)(1) of the Act, 21 U.S.C. section 360bbb-3(b)(1), unless the authorization is terminated or revoked.  Performed at [facility], [address], [place], [place] [address]      Radiological Exams on Admission: DG Chest 2 View  Result Date: 06/10/2020 CLINICAL DATA:  Sickle cell crisis, posterior chest/rib pain in the mid  back, current smoker EXAM: CHEST - 2 VIEW COMPARISON:  None. FINDINGS: Coarsened interstitial changes may reflect chronic lung sequela in a sickle cell patient. Some more patchy and bandlike opacities present along the left hemidiaphragm which could reflect scarring versus an acute airspace process. No other focal airspace disease. No pneumothorax or visible effusion. No mediastinal fluid or gas. Normal thyroid gland and thoracic inlet. No acute abnormality of the trachea or esophagus. No worrisome mediastinal, hilar or axillary adenopathy. Mild osseous manifestations of sickle cell disease. IMPRESSION: Opacity in the left lung base could reflect scarring or more acute airspace process of in a patient with history of sickle cell and acute onset chest pain. Chronically coarsened interstitial changes. Mild osseous manifestations of sickle cell. Electronically Signed   By: Kreg Shropshire M.D.   On: 06/10/2020 17:18     Assessment/Plan Principal Problem:   Sickle cell pain crisis [facility]) Active Problems:   Community acquired pneumonia of left lower lobe of lung    1. Sickle cell pain crisis on Dilaudid PCA and IV Toradol IV fluids. 2. Possible pneumonia on empiric antibiotics.  Follow sputum cultures.  Closely monitor respiratory status.  COVID test was negative. 3. Sickle cell anemia -hemoglobin at baseline.  Follow CBC.   Since patient is requiring Dilaudid PCA for pain control with pneumonia and sickle pain crisis will need inpatient status.   DVT prophylaxis: Lovenox. Code Status: Full code. Family Communication: Discussed with patient. Disposition Plan: Home. Consults called: None. Admission status: Inpatient.   Eduard Clos MD [facility] Pager [phone].  If 7PM-7AM, please contact night-coverage [email or web address] Password [email or web address]  06/11/2020, 3:02 AM

## 2020-06-11 NOTE — Progress Notes (Signed)
Brooke Patterson is a 20 year old female with a medical history significant for sickle cell disease was admitted overnight for community required pneumonia in the setting of sickle cell pain crisis.  Patient continues to have some pain to left chest and bilateral lower extremities.  She rates her pain as 7/10.  She says that pain intensity has improved since admission.  Care plan: Reduce IV fluids to [facility] Continue empiric antibiotics Reassess in a.m.  Nolon Nations  APRN, MSN, FNP-C Patient Care [facility] [address] [place], [place] [address] [phone]

## 2020-06-11 NOTE — ED Notes (Signed)
Report called to Shannon, RN

## 2020-06-12 DIAGNOSIS — D5700 Hb-SS disease with crisis, unspecified: Secondary | ICD-10-CM | POA: Diagnosis not present

## 2020-06-12 LAB — CBC
HCT: 18.1 % — ABNORMAL LOW (ref 36.0–46.0)
Hemoglobin: 6.4 g/dL — CL (ref 12.0–15.0)
MCH: 31.2 pg (ref 26.0–34.0)
MCHC: 35.4 g/dL (ref 30.0–36.0)
MCV: 88.3 fL (ref 80.0–100.0)
Platelets: 268 10*3/uL (ref 150–400)
RBC: 2.05 MIL/uL — ABNORMAL LOW (ref 3.87–5.11)
RDW: 24.2 % — ABNORMAL HIGH (ref 11.5–15.5)
WBC: 9.2 10*3/uL (ref 4.0–10.5)
nRBC: 3.5 % — ABNORMAL HIGH (ref 0.0–0.2)

## 2020-06-12 LAB — BASIC METABOLIC PANEL
Anion gap: 11 (ref 5–15)
BUN: <5 mg/dL — ABNORMAL LOW (ref 6–20)
CO2: 22 mmol/L (ref 22–32)
Calcium: 8.8 mg/dL — ABNORMAL LOW (ref 8.9–10.3)
Chloride: 105 mmol/L (ref 98–111)
Creatinine, Ser: <0.3 mg/dL — ABNORMAL LOW (ref 0.44–1.00)
Glucose, Bld: 84 mg/dL (ref 70–99)
Potassium: 3.3 mmol/L — ABNORMAL LOW (ref 3.5–5.1)
Sodium: 138 mmol/L (ref 135–145)

## 2020-06-12 MED ORDER — OXYCODONE HCL 5 MG PO TABS
5.0000 mg | ORAL_TABLET | ORAL | Status: DC | PRN
  Administered 2020-06-13 – 2020-06-14 (×3): 5 mg via ORAL
  Filled 2020-06-12 (×3): qty 1

## 2020-06-12 MED ORDER — PHENOL 1.4 % MT LIQD
1.0000 | OROMUCOSAL | Status: DC | PRN
  Administered 2020-06-12: 1 via OROMUCOSAL
  Filled 2020-06-12: qty 177

## 2020-06-12 MED ORDER — KETOROLAC TROMETHAMINE 15 MG/ML IJ SOLN
15.0000 mg | Freq: Four times a day (QID) | INTRAMUSCULAR | Status: DC
  Administered 2020-06-12 – 2020-06-15 (×13): 15 mg via INTRAVENOUS
  Filled 2020-06-12 (×13): qty 1

## 2020-06-12 NOTE — Progress Notes (Signed)
Date and time results received: 06/12/20 0744 (use smartphrase ".now" to insert current time)  Test: hgb Critical Value: 6.4  Name of Provider Notified: on call sickle cell   Orders Received? Or Actions Taken?: Actions Taken: waiting for response

## 2020-06-13 DIAGNOSIS — D5700 Hb-SS disease with crisis, unspecified: Secondary | ICD-10-CM | POA: Diagnosis not present

## 2020-06-13 LAB — CBC
HCT: 18.6 % — ABNORMAL LOW (ref 36.0–46.0)
Hemoglobin: 6.8 g/dL — CL (ref 12.0–15.0)
MCH: 32.4 pg (ref 26.0–34.0)
MCHC: 36.6 g/dL — ABNORMAL HIGH (ref 30.0–36.0)
MCV: 88.6 fL (ref 80.0–100.0)
Platelets: 262 10*3/uL (ref 150–400)
RBC: 2.1 MIL/uL — ABNORMAL LOW (ref 3.87–5.11)
RDW: 24.2 % — ABNORMAL HIGH (ref 11.5–15.5)
WBC: 9 10*3/uL (ref 4.0–10.5)
nRBC: 3.4 % — ABNORMAL HIGH (ref 0.0–0.2)

## 2020-06-13 LAB — PREPARE RBC (CROSSMATCH)

## 2020-06-13 LAB — ABO/RH: ABO/RH(D): O POS

## 2020-06-13 MED ORDER — DIPHENHYDRAMINE HCL 25 MG PO CAPS
25.0000 mg | ORAL_CAPSULE | Freq: Once | ORAL | Status: AC
  Administered 2020-06-13: 25 mg via ORAL
  Filled 2020-06-13: qty 1

## 2020-06-13 MED ORDER — SODIUM CHLORIDE 0.9% IV SOLUTION: Freq: Once | INTRAVENOUS | Status: AC

## 2020-06-13 MED ORDER — AMOXICILLIN-POT CLAVULANATE 875-125 MG PO TABS
1.0000 | ORAL_TABLET | Freq: Two times a day (BID) | ORAL | Status: DC
  Administered 2020-06-13 – 2020-06-15 (×5): 1 via ORAL
  Filled 2020-06-13 (×5): qty 1

## 2020-06-13 NOTE — Progress Notes (Signed)
Subjective: Brooke Patterson is a 20 year old with a medical history significant for sickle cell disease that was admitted for community-acquired pneumonia in the setting of sickle cell pain crisis. Today, patient states that pain intensity has improved.  Pain is primarily to chest and lower extremities.  She characterizes pain as intermittent and aching.  Today, patient's hemoglobin is 6.8, which appears to be below her baseline.  She denies any dizziness, shortness of breath, headache, heart palpitations, urinary symptoms, nausea, vomiting, or diarrhea.  Objective:  Vital signs in last 24 hours:  Vitals:   06/13/20 1134 06/13/20 1206 06/13/20 1437 06/13/20 1443  BP: 111/67 106/62  104/67  Pulse: 89 85  76  Resp: 15 14 15 15   Temp: 98.5 F (36.9 C) 98.3 F (36.8 C)  98.5 F (36.9 C)  TempSrc: Oral Oral  Oral  SpO2: 94% 92% 97% 96%  Weight:      Height:        Intake/Output from previous day:   Intake/Output Summary (Last 24 hours) at 06/13/2020 1539 Last data filed at 06/13/2020 1430 Gross per 24 hour  Intake 357.67 ml  Output 900 ml  Net -542.33 ml    Physical Exam: General: Alert, awake, oriented x3, in no acute distress.  HEENT: [place]/AT PEERL, EOMI Neck: Trachea midline,  no masses, no thyromegal,y no JVD, no carotid bruit OROPHARYNX:  Moist, No exudate/ erythema/lesions.  Heart: Regular rate and rhythm, without murmurs, rubs, gallops, PMI non-displaced, no heaves or thrills on palpation.  Lungs: Clear to auscultation, no wheezing or rhonchi noted. No increased vocal fremitus resonant to percussion  Abdomen: Soft, nontender, nondistended, positive bowel sounds, no masses no hepatosplenomegaly noted..  Neuro: No focal neurological deficits noted cranial nerves II through XII grossly intact. DTRs 2+ bilaterally upper and lower extremities. Strength 5 out of 5 in bilateral upper and lower extremities. Musculoskeletal: No warm swelling or erythema around joints, no spinal  tenderness noted. Psychiatric: Patient alert and oriented x3, good insight and cognition, good recent to remote recall. Lymph node survey: No cervical axillary or inguinal lymphadenopathy noted.  Lab Results:  Basic Metabolic Panel:    Component Value Date/Time   NA 138 06/12/2020 0601   K 3.3 (L) 06/12/2020 0601   CL 105 06/12/2020 0601   CO2 22 06/12/2020 0601   BUN <5 (L) 06/12/2020 0601   CREATININE <0.30 (L) 06/12/2020 0601   GLUCOSE 84 06/12/2020 0601   CALCIUM 8.8 (L) 06/12/2020 0601   CBC:    Component Value Date/Time   WBC 9.0 06/13/2020 0523   HGB 6.8 (LL) 06/13/2020 0523   HCT 18.6 (L) 06/13/2020 0523   PLT 262 06/13/2020 0523   MCV 88.6 06/13/2020 0523   NEUTROABS 8.4 (H) 06/11/2020 0517   LYMPHSABS 2.0 06/11/2020 0517   MONOABS 1.4 (H) 06/11/2020 0517   EOSABS 0.0 06/11/2020 0517   BASOSABS 0.1 06/11/2020 0517    Recent Results (from the past 240 hour(s))  Blood culture (routine x 2)     Status: None (Preliminary result)   Collection Time: 06/10/20 10:45 PM   Specimen: BLOOD  Result Value Ref Range Status   Specimen Description   Final    BLOOD BOTTLES DRAWN AEROBIC AND ANAEROBIC Performed at [facility], [address] [place], [place]    Special Requests   Final    LEFT ANTECUBITAL Blood Culture results may not be optimal due to an excessive volume of blood received in culture bottles Performed at [facility], [address] [place], [place] [address]    Culture   Final    NO GROWTH 2 DAYS Performed at [facility], [address] [place], [place] [address]    Report Status PENDING  Incomplete  Blood culture (routine x 2)     Status: None (Preliminary result)   Collection Time: 06/10/20 10:50 PM   Specimen: BLOOD  Result Value Ref Range Status   Specimen Description   Final    BLOOD BOTTLES DRAWN AEROBIC AND ANAEROBIC Performed at [facility], [address],  [place], [place] [address]    Special Requests   Final    LEFT ANTECUBITAL Blood Culture results may not be optimal due to an excessive volume of blood received in culture bottles Performed at [facility], [address], [place], [place] [address]    Culture   Final    NO GROWTH 2 DAYS Performed at [facility], [address] [place], [place] [address]    Report Status PENDING  Incomplete  Resp Panel by RT-PCR (Flu A&B, Covid) Nasopharyngeal Swab     Status: None   Collection Time: 06/10/20 11:01 PM   Specimen: Nasopharyngeal Swab; Nasopharyngeal(NP) swabs in vial transport medium  Result Value Ref Range Status   SARS Coronavirus 2 by RT PCR NEGATIVE NEGATIVE Final    Comment: (NOTE) SARS-CoV-2 target nucleic acids are NOT DETECTED.  The SARS-CoV-2 RNA is generally detectable in upper respiratory specimens during the acute phase of infection. The lowest concentration of SARS-CoV-2 viral copies this assay can detect is 138 copies/mL. A negative result does not preclude SARS-Cov-2 infection and should not be used as the sole basis for treatment or other patient management decisions. A negative result may occur with  improper specimen collection/handling, submission of specimen other than nasopharyngeal swab, presence of viral mutation(s) within the areas targeted by this assay, and inadequate number of viral copies(<138 copies/mL). A negative result must be combined with clinical observations, patient history, and epidemiological information. The expected result is Negative.  Fact Sheet for Patients:  BloggerCourse.com  Fact Sheet for Healthcare Providers:  SeriousBroker.it  This test is no t yet approved or cleared by the [place] FDA and  has been authorized for detection and/or diagnosis of SARS-CoV-2 by FDA under an Emergency Use Authorization (EUA). This EUA will remain  in effect (meaning this  test can be used) for the duration of the COVID-19 declaration under Section 564(b)(1) of the Act, 21 U.S.C.section 360bbb-3(b)(1), unless the authorization is terminated  or revoked sooner.       Influenza A by PCR NEGATIVE NEGATIVE Final   Influenza B by PCR NEGATIVE NEGATIVE Final    Comment: (NOTE) The Xpert Xpress SARS-CoV-2/FLU/RSV plus assay is intended as an aid in the diagnosis of influenza from Nasopharyngeal swab specimens and should not be used as a sole basis for treatment. Nasal washings and aspirates are unacceptable for Xpert Xpress SARS-CoV-2/FLU/RSV testing.  Fact Sheet for Patients: BloggerCourse.com  Fact Sheet for Healthcare Providers: SeriousBroker.it  This test is not yet approved or cleared by the [place] FDA and has been authorized for detection and/or diagnosis of SARS-CoV-2 by FDA under an Emergency Use Authorization (EUA). This EUA will remain in effect (meaning this test can be used) for the duration of the COVID-19 declaration under Section 564(b)(1) of the Act, 21 U.S.C. section 360bbb-3(b)(1), unless the authorization is terminated or revoked.  Performed at [facility], [address],  [place], [place] [address]     Studies/Results: No results found.  Medications: Scheduled Meds: . amoxicillin-clavulanate  1 tablet Oral Q12H  . enoxaparin (LOVENOX) injection  40 mg Subcutaneous Q24H  . HYDROmorphone   Intravenous Q4H  . ketorolac  15 mg Intravenous Q6H  . senna-docusate  1 tablet Oral BID   Continuous Infusions: PRN Meds:.naloxone **AND** sodium chloride flush, ondansetron (ZOFRAN) IV, oxyCODONE, phenol, polyethylene glycol  Consultants:  none  Procedures:  none  Antibiotics:  IV azithromycin  IV cefepime  Assessment/Plan: Principal Problem:   Sickle cell pain crisis (HCC) Active Problems:   Community acquired pneumonia of left lower lobe of  lung  Sickle cell disease with pain crisis: Continue IV fluids at [facility] Continue IV Dilaudid PCA per full dose Toradol 15 mg IV every 6 hours for total of 5 days Oxycodone 5 mg every 4 hours as needed for severe breakthrough pain Monitor vital signs closely, reevaluate pain scale regularly, and supplemental oxygen as needed.  Ambulating pulse ox in a.m.  Community-acquired pneumonia: Patient does not have an oxygen requirement and has remained afebrile overnight.  Discontinue IV antibiotics and transition to Augmentin 875-125 mg every 12 hours for a total of 7 days.  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  Transfuse 1 unit PRBCs today.  Continue to follow closely.  CBC in AM.  Hypokalemia: Potassium slightly decreased.  No repletion warranted at this time.  Continue to follow closely.  Labs in AM   Code Status: Full Code Family Communication: N/A Disposition Plan: Not yet ready for discharge   Nolon Nations  APRN, MSN, FNP-C [facility] [address] [place], [place] [address] [phone] If 7PM-7AM, please contact night-coverage.  06/13/2020, 3:39 PM  LOS: 2 days

## 2020-06-13 NOTE — Progress Notes (Signed)
Subjective: Brooke Patterson is a 20 year old female with a medical history significant for sickle cell disease was admitted for community-acquired pneumonia in the setting of sickle cell pain crisis. Patient is complaining of pain primarily to left chest and lower extremities.  She rates her pain as 5/10.  She denies any shortness of breath, headache, urinary symptoms, nausea, vomiting, or diarrhea.  Patient's oxygen saturation is 96% on RA and she is remained afebrile overnight.  Objective:  Vital signs in last 24 hours:  Vitals:   06/13/20 1134 06/13/20 1206 06/13/20 1437 06/13/20 1443  BP: 111/67 106/62  104/67  Pulse: 89 85  76  Resp: 15 14 15 15   Temp: 98.5 F (36.9 C) 98.3 F (36.8 C)  98.5 F (36.9 C)  TempSrc: Oral Oral  Oral  SpO2: 94% 92% 97% 96%  Weight:      Height:        Intake/Output from previous day:   Intake/Output Summary (Last 24 hours) at 06/13/2020 1548 Last data filed at 06/13/2020 1430 Gross per 24 hour  Intake 477.67 ml  Output 900 ml  Net -422.33 ml    Physical Exam: General: Alert, awake, oriented x3, in no acute distress.  HEENT: [place]/AT PEERL, EOMI Neck: Trachea midline,  no masses, no thyromegal,y no JVD, no carotid bruit OROPHARYNX:  Moist, No exudate/ erythema/lesions.  Heart: Regular rate and rhythm, without murmurs, rubs, gallops, PMI non-displaced, no heaves or thrills on palpation.  Lungs: Clear to auscultation, no wheezing or rhonchi noted. No increased vocal fremitus resonant to percussion  Abdomen: Soft, nontender, nondistended, positive bowel sounds, no masses no hepatosplenomegaly noted..  Neuro: No focal neurological deficits noted cranial nerves II through XII grossly intact. DTRs 2+ bilaterally upper and lower extremities. Strength 5 out of 5 in bilateral upper and lower extremities. Musculoskeletal: No warm swelling or erythema around joints, no spinal tenderness noted. Psychiatric: Patient alert and oriented x3, good insight and  cognition, good recent to remote recall. Lymph node survey: No cervical axillary or inguinal lymphadenopathy noted.  Lab Results:  Basic Metabolic Panel:    Component Value Date/Time   NA 138 06/12/2020 0601   K 3.3 (L) 06/12/2020 0601   CL 105 06/12/2020 0601   CO2 22 06/12/2020 0601   BUN <5 (L) 06/12/2020 0601   CREATININE <0.30 (L) 06/12/2020 0601   GLUCOSE 84 06/12/2020 0601   CALCIUM 8.8 (L) 06/12/2020 0601   CBC:    Component Value Date/Time   WBC 9.0 06/13/2020 0523   HGB 6.8 (LL) 06/13/2020 0523   HCT 18.6 (L) 06/13/2020 0523   PLT 262 06/13/2020 0523   MCV 88.6 06/13/2020 0523   NEUTROABS 8.4 (H) 06/11/2020 0517   LYMPHSABS 2.0 06/11/2020 0517   MONOABS 1.4 (H) 06/11/2020 0517   EOSABS 0.0 06/11/2020 0517   BASOSABS 0.1 06/11/2020 0517    Recent Results (from the past 240 hour(s))  Blood culture (routine x 2)     Status: None (Preliminary result)   Collection Time: 06/10/20 10:45 PM   Specimen: BLOOD  Result Value Ref Range Status   Specimen Description   Final    BLOOD BOTTLES DRAWN AEROBIC AND ANAEROBIC Performed at [facility], [address] [place], [place]    Special Requests   Final    LEFT ANTECUBITAL Blood Culture results may not be optimal due to an excessive volume of blood received in culture bottles Performed at [facility], [address] [place], [place]  [address]    Culture   Final    NO GROWTH 2 DAYS Performed at [facility], [address], [place], [place] [address]    Report Status PENDING  Incomplete  Blood culture (routine x 2)     Status: None (Preliminary result)   Collection Time: 06/10/20 10:50 PM   Specimen: BLOOD  Result Value Ref Range Status   Specimen Description   Final    BLOOD BOTTLES DRAWN AEROBIC AND ANAEROBIC Performed at [facility], [address] [place], [place] [address]    Special Requests   Final    LEFT ANTECUBITAL Blood  Culture results may not be optimal due to an excessive volume of blood received in culture bottles Performed at [facility], [address], [place], [place] [address]    Culture   Final    NO GROWTH 2 DAYS Performed at [facility], [address], [place], [place] [address]    Report Status PENDING  Incomplete  Resp Panel by RT-PCR (Flu A&B, Covid) Nasopharyngeal Swab     Status: None   Collection Time: 06/10/20 11:01 PM   Specimen: Nasopharyngeal Swab; Nasopharyngeal(NP) swabs in vial transport medium  Result Value Ref Range Status   SARS Coronavirus 2 by RT PCR NEGATIVE NEGATIVE Final    Comment: (NOTE) SARS-CoV-2 target nucleic acids are NOT DETECTED.  The SARS-CoV-2 RNA is generally detectable in upper respiratory specimens during the acute phase of infection. The lowest concentration of SARS-CoV-2 viral copies this assay can detect is 138 copies/mL. A negative result does not preclude SARS-Cov-2 infection and should not be used as the sole basis for treatment or other patient management decisions. A negative result may occur with  improper specimen collection/handling, submission of specimen other than nasopharyngeal swab, presence of viral mutation(s) within the areas targeted by this assay, and inadequate number of viral copies(<138 copies/mL). A negative result must be combined with clinical observations, patient history, and epidemiological information. The expected result is Negative.  Fact Sheet for Patients:  BloggerCourse.com  Fact Sheet for Healthcare Providers:  SeriousBroker.it  This test is no t yet approved or cleared by the [place] FDA and  has been authorized for detection and/or diagnosis of SARS-CoV-2 by FDA under an Emergency Use Authorization (EUA). This EUA will remain  in effect (meaning this test can be used) for the duration of the COVID-19 declaration under Section  564(b)(1) of the Act, 21 U.S.C.section 360bbb-3(b)(1), unless the authorization is terminated  or revoked sooner.       Influenza A by PCR NEGATIVE NEGATIVE Final   Influenza B by PCR NEGATIVE NEGATIVE Final    Comment: (NOTE) The Xpert Xpress SARS-CoV-2/FLU/RSV plus assay is intended as an aid in the diagnosis of influenza from Nasopharyngeal swab specimens and should not be used as a sole basis for treatment. Nasal washings and aspirates are unacceptable for Xpert Xpress SARS-CoV-2/FLU/RSV testing.  Fact Sheet for Patients: BloggerCourse.com  Fact Sheet for Healthcare Providers: SeriousBroker.it  This test is not yet approved or cleared by the [place] FDA and has been authorized for detection and/or diagnosis of SARS-CoV-2 by FDA under an Emergency Use Authorization (EUA). This EUA will remain in effect (meaning this test can be used) for the duration of the COVID-19 declaration under Section 564(b)(1) of the Act, 21 U.S.C. section 360bbb-3(b)(1), unless the authorization is terminated or revoked.  Performed at [facility], [address], [place], [place] [address]  Studies/Results: No results found.  Medications: Scheduled Meds: . amoxicillin-clavulanate  1 tablet Oral Q12H  . enoxaparin (LOVENOX) injection  40 mg Subcutaneous Q24H  . HYDROmorphone   Intravenous Q4H  . ketorolac  15 mg Intravenous Q6H  . senna-docusate  1 tablet Oral BID   Continuous Infusions: PRN Meds:.naloxone **AND** sodium chloride flush, ondansetron (ZOFRAN) IV, oxyCODONE, phenol, polyethylene glycol  Consultants:  None  Procedures:  None  Antibiotics:  IV azithromycin  IV ceftriaxone  Assessment/Plan: Principal Problem:   Sickle cell pain crisis (HCC) Active Problems:   Community acquired pneumonia of left lower lobe of lung  Sickle cell disease with pain crisis: Reduce IV fluids to  KVO Continue IV Dilaudid PCA per full dose Toradol 15 mg IV every 6 hours for total of 5 days Monitor vital signs very closely, reevaluate pain scale regularly, and supplemental oxygen as needed  Community-acquired pneumonia: Patient's chest x-ray showed opacity in the left lung base that could reflect scarring or more acute airspace process of a patient with a history of sickle cell disease.  IV azithromycin and IV ceftriaxone were initiated, will continue.  Patient has remained afebrile overnight and does not have an oxygen requirement at this time.  We will consider transitioning to oral medications on 06/13/2020.  Sickle cell anemia: Today, patient's hemoglobin is 6.4.  Probable hemodilution.  Decrease IV fluids.  Repeat CBC in AM.  If hemoglobin remains less than 7 g/dL in a.m., transfuse 1 unit PRBCs. Hypokalemia: Potassium slightly decreased.  Continue to monitor closely.  Follow labs in AM.  Code Status: Full Code Family Communication: N/A Disposition Plan: Not yet ready for discharge  Nolon Nations  APRN, MSN, FNP-C Patient [facility] Center [facility] [address] [place], [place] [address] [phone]  If 5PM-7AM, please contact night-coverage.  06/13/2020, 3:48 PM  LOS: 2 days

## 2020-06-14 NOTE — Progress Notes (Signed)
Report given to [place], RN. 

## 2020-06-15 MED ORDER — AMOXICILLIN-POT CLAVULANATE 875-125 MG PO TABS: 1.0000 | ORAL_TABLET | Freq: Two times a day (BID) | ORAL | 0 refills | Status: DC

## 2020-06-15 NOTE — Progress Notes (Signed)
Reviewed discharge papers and prescription med. Patient declined wheelchair escort to main entrance.

## 2020-06-15 NOTE — Discharge Summary (Signed)
Physician Discharge Summary  Patient ID: Brooke Patterson MRN: [id number] DOB/AGE: Jan 19, 2001 20 y.o.  Admit date: 06/10/2020 Discharge date: 06/15/2020  Admission Diagnoses:  Discharge Diagnoses:  Principal Problem:   Sickle cell pain crisis [facility]) Active Problems:   Community acquired pneumonia of left lower lobe of lung   Discharged Condition: good  Hospital Course: Patient admitted with sickle cell painful crisis and community-acquired pneumonia involving the left lower lobe.  She was treated with IV Rocephin and Zithromax, Dilaudid PCA and other supportive care.  Cefepime was substituted for Rocephin at some point.  Patient did much better and was transition from IV antibiotics to oral Augmentin.  Her sickle cell crisis was treated with Toradol, oxycodone 5 mg every 4 hours as well as PCA full dose.  She was also noted to be hypokalemic and potassium was repleted.  She did much better and the time of discharge pain was down to 3 out of 10 and patient felt better to go home.  She was discharged to follow-up with PCP and complete 7 days of oral antibiotics.  Consults: None  Significant Diagnostic Studies: labs: CBC, CMP and chest x-ray performed.  Treatments: IV hydration, antibiotics: ceftriaxone and azithromycin and analgesia: acetaminophen and Dilaudid  Discharge Exam: Blood pressure 112/65, pulse 88, temperature 98.3 F (36.8 C), temperature source Oral, resp. rate 16, height 5\' 6"  (1.676 m), weight 64.2 kg, SpO2 92 %. General appearance: alert, cooperative, appears stated age and no distress Resp: clear to auscultation bilaterally Cardio: regular rate and rhythm, S1, S2 normal, no murmur, click, rub or gallop GI: soft, non-tender; bowel sounds normal; no masses,  no organomegaly Pulses: 2+ and symmetric Skin: Skin color, texture, turgor normal. No rashes or lesions Neurologic: Grossly normal  Disposition:    Allergies as of 06/15/2020      Reactions   Acetaminophen Nausea  And Vomiting   nausea Other reaction(s): Vomiting   Morphine And Related    nausea      Medication List    TAKE these medications   amoxicillin-clavulanate 875-125 MG tablet Commonly known as: AUGMENTIN Take 1 tablet by mouth every 12 (twelve) hours.   escitalopram 5 MG tablet Commonly known as: LEXAPRO Take 5 mg by mouth daily.   folic acid 1 MG tablet Commonly known as: FOLVITE Take 1 tablet (1 mg total) by mouth daily.   ibuprofen 800 MG tablet Commonly known as: ADVIL Take 1 tablet (800 mg total) by mouth every 8 (eight) hours as needed. What changed: reasons to take this   Oxycodone HCl 10 MG Tabs Take 0.5 tablets (5 mg total) by mouth every 6 (six) hours as needed. What changed: reasons to take this        Signed: Shalin Vonbargen,LAWAL 06/15/2020, 3:08 PM   Time spent 35 minutes

## 2020-06-16 LAB — CULTURE, BLOOD (ROUTINE X 2)
Culture: NO GROWTH
Culture: NO GROWTH

## 2020-06-16 LAB — TYPE AND SCREEN
ABO/RH(D): O POS
Antibody Screen: NEGATIVE
Unit division: 0

## 2020-06-16 LAB — BPAM RBC
Blood Product Expiration Date: 202206152359
ISSUE DATE / TIME: 202205201147
Unit Type and Rh: 9500

## 2020-07-03 ENCOUNTER — Encounter (HOSPITAL_BASED_OUTPATIENT_CLINIC_OR_DEPARTMENT_OTHER): Payer: Self-pay | Admitting: Emergency Medicine

## 2020-07-03 ENCOUNTER — Emergency Department (HOSPITAL_BASED_OUTPATIENT_CLINIC_OR_DEPARTMENT_OTHER)
Admission: EM | Admit: 2020-07-03 | Discharge: 2020-07-03 | Disposition: A | Discharge status: Home / Self Care | Payer: Federal, State, Local not specified - PPO | Attending: Emergency Medicine | Admitting: Emergency Medicine

## 2020-07-03 ENCOUNTER — Other Ambulatory Visit: Payer: Self-pay

## 2020-07-03 DIAGNOSIS — D5700 Hb-SS disease with crisis, unspecified: Secondary | ICD-10-CM | POA: Insufficient documentation

## 2020-07-03 DIAGNOSIS — F17200 Nicotine dependence, unspecified, uncomplicated: Secondary | ICD-10-CM | POA: Insufficient documentation

## 2020-07-03 DIAGNOSIS — M545 Low back pain, unspecified: Secondary | ICD-10-CM | POA: Diagnosis present

## 2020-07-03 LAB — COMPREHENSIVE METABOLIC PANEL
ALT: 20 U/L (ref 0–44)
AST: 46 U/L — ABNORMAL HIGH (ref 15–41)
Albumin: 4.5 g/dL (ref 3.5–5.0)
Alkaline Phosphatase: 112 U/L (ref 38–126)
Anion gap: 10 (ref 5–15)
BUN: 6 mg/dL (ref 6–20)
CO2: 24 mmol/L (ref 22–32)
Calcium: 9.5 mg/dL (ref 8.9–10.3)
Chloride: 104 mmol/L (ref 98–111)
Creatinine, Ser: 0.34 mg/dL — ABNORMAL LOW (ref 0.44–1.00)
GFR, Estimated: >60 mL/min (ref >60)
Glucose, Bld: 87 mg/dL (ref 70–99)
Potassium: 3.6 mmol/L (ref 3.5–5.1)
Sodium: 138 mmol/L (ref 135–145)
Total Bilirubin: 3.9 mg/dL — ABNORMAL HIGH (ref 0.3–1.2)
Total Protein: 8.2 g/dL — ABNORMAL HIGH (ref 6.5–8.1)

## 2020-07-03 LAB — CBC WITH DIFFERENTIAL/PLATELET
Abs Immature Granulocytes: 0.22 10*3/uL — ABNORMAL HIGH (ref 0.00–0.07)
Basophils Absolute: 0.1 10*3/uL (ref 0.0–0.1)
Basophils Relative: 1 %
Eosinophils Absolute: 0.2 10*3/uL (ref 0.0–0.5)
Eosinophils Relative: 2 %
HCT: 23 % — ABNORMAL LOW (ref 36.0–46.0)
Hemoglobin: 8.3 g/dL — ABNORMAL LOW (ref 12.0–15.0)
Immature Granulocytes: 2 %
Lymphocytes Relative: 30 %
Lymphs Abs: 3.5 10*3/uL (ref 0.7–4.0)
MCH: 31.3 pg (ref 26.0–34.0)
MCHC: 36.1 g/dL — ABNORMAL HIGH (ref 30.0–36.0)
MCV: 86.8 fL (ref 80.0–100.0)
Monocytes Absolute: 0.7 10*3/uL (ref 0.1–1.0)
Monocytes Relative: 6 %
Neutro Abs: 6.9 10*3/uL (ref 1.7–7.7)
Neutrophils Relative %: 59 %
Platelets: 350 10*3/uL (ref 150–400)
RBC: 2.65 MIL/uL — ABNORMAL LOW (ref 3.87–5.11)
RDW: 21.2 % — ABNORMAL HIGH (ref 11.5–15.5)
WBC: 11.6 10*3/uL — ABNORMAL HIGH (ref 4.0–10.5)
nRBC: 1.6 % — ABNORMAL HIGH (ref 0.0–0.2)

## 2020-07-03 LAB — RETICULOCYTES
Immature Retic Fract: 17.2 % — ABNORMAL HIGH (ref 2.3–15.9)
RBC.: 2.59 MIL/uL — ABNORMAL LOW (ref 3.87–5.11)
Retic Count, Absolute: 257 10*3/uL — ABNORMAL HIGH (ref 19.0–186.0)
Retic Ct Pct: 9.7 % — ABNORMAL HIGH (ref 0.4–3.1)

## 2020-07-03 MED ORDER — SODIUM CHLORIDE 0.45 % IV SOLN: INTRAVENOUS | Status: DC

## 2020-07-03 MED ORDER — HYDROMORPHONE HCL 1 MG/ML IJ SOLN
2.0000 mg | INTRAMUSCULAR | Status: AC
  Administered 2020-07-03: 2 mg via INTRAVENOUS
  Filled 2020-07-03: qty 2

## 2020-07-03 MED ORDER — ONDANSETRON HCL 4 MG/2ML IJ SOLN
4.0000 mg | INTRAMUSCULAR | Status: DC | PRN
  Administered 2020-07-03: 4 mg via INTRAVENOUS
  Filled 2020-07-03: qty 2

## 2020-07-03 MED ORDER — DIPHENHYDRAMINE HCL 50 MG/ML IJ SOLN
25.0000 mg | Freq: Once | INTRAMUSCULAR | Status: AC
  Administered 2020-07-03: 25 mg via INTRAVENOUS
  Filled 2020-07-03: qty 1

## 2020-07-03 MED ORDER — DIPHENHYDRAMINE HCL 25 MG PO CAPS: 25.0000 mg | ORAL_CAPSULE | ORAL | Status: DC | PRN

## 2020-07-03 MED ORDER — HYDROMORPHONE HCL 1 MG/ML IJ SOLN
2.0000 mg | INTRAMUSCULAR | Status: AC
Start: 2020-07-03 — End: 2020-07-03
  Administered 2020-07-03: 2 mg via INTRAVENOUS
  Filled 2020-07-03: qty 2

## 2020-07-03 MED ORDER — PROCHLORPERAZINE EDISYLATE 10 MG/2ML IJ SOLN
10.0000 mg | Freq: Once | INTRAMUSCULAR | Status: AC
  Administered 2020-07-03: 10 mg via INTRAVENOUS
  Filled 2020-07-03: qty 2

## 2020-07-03 NOTE — ED Provider Notes (Signed)
[facility] EMERGENCY DEPT Provider Note   CSN: [id number] Arrival date & time: 07/03/20  1641     History Chief Complaint  Patient presents with   Sickle Cell Pain Crisis    Brooke Patterson is a 20 y.o. female.  20 yo F with a chief complaints of sickle cell pain crisis.  This started less than an hour ago.  Feels like her typical sickle cell pain.  Low back pain while at work.  No new areas of pain.  No fevers no cough no shortness of breath.  The history is provided by the patient.  Sickle Cell Pain Crisis Location:  Back Severity:  Severe Onset quality:  Sudden Duration:  1 hour Similar to previous crisis episodes: yes   Progression:  Unchanged Chronicity:  Recurrent Relieved by:  Nothing Worsened by:  Nothing Ineffective treatments:  None tried Associated symptoms: no chest pain, no congestion, no fever, no headaches, no nausea, no shortness of breath, no vomiting and no wheezing       Past Medical History:  Diagnosis Date   Sickle cell anemia (HCC)     Patient Active Problem List   Diagnosis Date Noted   Community acquired pneumonia of left lower lobe of lung 06/11/2020   E-coli UTI 04/11/2020   Acute cystitis without hematuria    Sickle cell pain crisis (HCC) 04/08/2020    History reviewed. No pertinent surgical history.   OB History   No obstetric history on file.     Family History  Family history unknown: Yes    Social History   Tobacco Use   Smoking status: Every Day    Pack years: 0.00   Smokeless tobacco: Never  Substance Use Topics   Alcohol use: Yes   Drug use: Yes    Types: Marijuana    Home Medications Prior to Admission medications   Medication Sig Start Date End Date Taking? Authorizing Provider  amoxicillin-clavulanate (AUGMENTIN) 875-125 MG tablet Take 1 tablet by mouth every 12 (twelve) hours. 06/15/20   Rometta Emery, MD  escitalopram (LEXAPRO) 5 MG tablet Take 5 mg by mouth daily. 06/06/20   [provider]  folic acid (FOLVITE) 1 MG tablet Take 1 tablet (1 mg total) by mouth daily. Patient not taking: Reported on 06/11/2020 04/11/20 04/11/21  Massie Maroon, FNP  ibuprofen (ADVIL) 800 MG tablet Take 1 tablet (800 mg total) by mouth every 8 (eight) hours as needed. Patient taking differently: Take 800 mg by mouth every 8 (eight) hours as needed for mild pain. 04/11/20   Massie Maroon, FNP  oxyCODONE 10 MG TABS Take 0.5 tablets (5 mg total) by mouth every 6 (six) hours as needed. Patient taking differently: Take 5 mg by mouth every 6 (six) hours as needed (pain). 04/11/20   Massie Maroon, FNP    Allergies    Acetaminophen and Morphine and related  Review of Systems   Review of Systems  Constitutional:  Negative for chills and fever.  HENT:  Negative for congestion and rhinorrhea.   Eyes:  Negative for redness and visual disturbance.  Respiratory:  Negative for shortness of breath and wheezing.   Cardiovascular:  Negative for chest pain and palpitations.  Gastrointestinal:  Negative for nausea and vomiting.  Genitourinary:  Negative for dysuria and urgency.  Musculoskeletal:  Positive for arthralgias and myalgias.  Skin:  Negative for pallor and wound.  Neurological:  Negative for dizziness and headaches.   Physical Exam Updated Vital Signs BP  118/71   Pulse 64   Temp 98.6 F (37 C) (Oral)   Resp 10   Ht 5\' 6"  (1.676 m)   Wt 64.2 kg   LMP 06/25/2020   SpO2 99%   BMI 22.84 kg/m   Physical Exam Vitals and nursing note reviewed.  Constitutional:      General: She is not in acute distress.    Appearance: She is well-developed. She is not diaphoretic.  HENT:     Head: Normocephalic and atraumatic.  Eyes:     Pupils: Pupils are equal, round, and reactive to light.  Cardiovascular:     Rate and Rhythm: Normal rate and regular rhythm.     Heart sounds: No murmur heard.   No friction rub. No gallop.  Pulmonary:     Effort: Pulmonary effort is normal.      Breath sounds: No wheezing or rales.  Abdominal:     General: There is no distension.     Palpations: Abdomen is soft.     Tenderness: There is no abdominal tenderness.  Musculoskeletal:        General: No tenderness.     Cervical back: Normal range of motion and neck supple.  Skin:    General: Skin is warm and dry.  Neurological:     Mental Status: She is alert and oriented to person, place, and time.  Psychiatric:        Behavior: Behavior normal.    ED Results / Procedures / Treatments   Labs (all labs ordered are listed, but only abnormal results are displayed) Labs Reviewed  CBC WITH DIFFERENTIAL/PLATELET - Abnormal; Notable for the following components:      Result Value   WBC 11.6 (*)    RBC 2.65 (*)    Hemoglobin 8.3 (*)    HCT 23.0 (*)    MCHC 36.1 (*)    RDW 21.2 (*)    nRBC 1.6 (*)    Abs Immature Granulocytes 0.22 (*)    All other components within normal limits  RETICULOCYTES - Abnormal; Notable for the following components:   Retic Ct Pct 9.7 (*)    RBC. 2.59 (*)    Retic Count, Absolute 257.0 (*)    Immature Retic Fract 17.2 (*)    All other components within normal limits  COMPREHENSIVE METABOLIC PANEL - Abnormal; Notable for the following components:   Creatinine, Ser 0.34 (*)    Total Protein 8.2 (*)    AST 46 (*)    Total Bilirubin 3.9 (*)    All other components within normal limits  PREGNANCY, URINE  URINALYSIS, ROUTINE W REFLEX MICROSCOPIC    EKG None  Radiology No results found.  Procedures Procedures   Medications Ordered in ED Medications  0.45 % sodium chloride infusion ( Intravenous New Bag/Given 07/03/20 1703)  HYDROmorphone (DILAUDID) injection 2 mg (2 mg Intravenous Given 07/03/20 1832)  diphenhydrAMINE (BENADRYL) capsule 25-50 mg (has no administration in time range)  ondansetron (ZOFRAN) injection 4 mg (4 mg Intravenous Given 07/03/20 1705)  HYDROmorphone (DILAUDID) injection 2 mg (2 mg Intravenous Given 07/03/20 1705)   prochlorperazine (COMPAZINE) injection 10 mg (10 mg Intravenous Given 07/03/20 1832)  diphenhydrAMINE (BENADRYL) injection 25 mg (25 mg Intravenous Given 07/03/20 1832)    ED Course  I have reviewed the triage vital signs and the nursing notes.  Pertinent labs & imaging results that were available during my care of the patient were reviewed by me and considered in my medical decision making (see  chart for details).    MDM Rules/Calculators/A&P                          20 yo F with a chief complaint of a sickle cell pain crisis.  This started abruptly right before arrival.  Feels like her sickle cell.  Unsure of what may have caused it.  We will treat per the sickle cell pain protocol.  Reassess.  Patient's hemoglobin is above baseline.  Patient feeling better on reassessment.  Feels like she be able to go home.  Will discharge home.  PCP follow-up.  7:28 PM:  I have discussed the diagnosis/risks/treatment options with the patient and believe the pt to be eligible for discharge home to follow-up with PCP, sickle cell clinic. We also discussed returning to the ED immediately if new or worsening sx occur. We discussed the sx which are most concerning (e.g., sudden worsening pain, fever, inability to tolerate by mouth) that necessitate immediate return. Medications administered to the patient during their visit and any new prescriptions provided to the patient are listed below.  Medications given during this visit Medications  0.45 % sodium chloride infusion ( Intravenous New Bag/Given 07/03/20 1703)  HYDROmorphone (DILAUDID) injection 2 mg (2 mg Intravenous Given 07/03/20 1832)  diphenhydrAMINE (BENADRYL) capsule 25-50 mg (has no administration in time range)  ondansetron (ZOFRAN) injection 4 mg (4 mg Intravenous Given 07/03/20 1705)  HYDROmorphone (DILAUDID) injection 2 mg (2 mg Intravenous Given 07/03/20 1705)  prochlorperazine (COMPAZINE) injection 10 mg (10 mg Intravenous Given 07/03/20 1832)   diphenhydrAMINE (BENADRYL) injection 25 mg (25 mg Intravenous Given 07/03/20 1832)     The patient appears reasonably screen and/or stabilized for discharge and I doubt any other medical condition or other [facility] requiring further screening, evaluation, or treatment in the ED at this time prior to discharge.   Final Clinical Impression(s) / ED Diagnoses Final diagnoses:  Sickle cell pain crisis [facility])    Rx / DC Orders ED Discharge Orders     None        Melene Plan, DO 07/03/20 1928

## 2020-07-03 NOTE — ED Triage Notes (Signed)
Patient arrives to ED with c/o of sickle cell pain crisis. Pt reports sudden onset of severe lower back pain while at work. Pt states she has taken 800mg  motrin, 10 mg Oxycodone, and 25mg  phenergan w/o relief of pain. Pt denies SOB but mild throbbing in the chest. Mild nausea and x1 episode of emesis.

## 2020-07-03 NOTE — ED Notes (Signed)
Pt actively vomiting in bed, MD notified.

## 2020-07-03 NOTE — ED Notes (Signed)
Pt placed on 2L [place] at this time. 

## 2020-07-03 NOTE — Discharge Instructions (Addendum)
Follow up with your sickle cell doc.  Return for worsening pain, fever, pain not typical of your sickle cell

## 2020-07-03 NOTE — ED Notes (Signed)
Patient placed on ETCO2. Patient has sickle cell and is receiving pain medications. ETCO2 placed on 2 L w/ readings 42 at this time. Patient resting comfortably at this time.

## 2020-07-04 ENCOUNTER — Emergency Department (HOSPITAL_COMMUNITY): Payer: Federal, State, Local not specified - PPO

## 2020-07-04 ENCOUNTER — Emergency Department (HOSPITAL_COMMUNITY)
Admission: EM | Admit: 2020-07-04 | Discharge: 2020-07-04 | Disposition: A | Discharge status: Home / Self Care | Payer: Federal, State, Local not specified - PPO | Attending: Emergency Medicine | Admitting: Emergency Medicine

## 2020-07-04 ENCOUNTER — Encounter (HOSPITAL_COMMUNITY): Payer: Self-pay

## 2020-07-04 ENCOUNTER — Non-Acute Institutional Stay (EMERGENCY_DEPARTMENT_HOSPITAL)
Admission: AD | Admit: 2020-07-04 | Discharge: 2020-07-04 | Disposition: A | Discharge status: Home / Self Care | Payer: Federal, State, Local not specified - PPO | Source: Ambulatory Visit | Attending: Internal Medicine | Admitting: Internal Medicine

## 2020-07-04 ENCOUNTER — Encounter (HOSPITAL_COMMUNITY): Payer: Self-pay | Admitting: Family Medicine

## 2020-07-04 DIAGNOSIS — R638 Other symptoms and signs concerning food and fluid intake: Secondary | ICD-10-CM | POA: Diagnosis not present

## 2020-07-04 DIAGNOSIS — H53149 Visual discomfort, unspecified: Secondary | ICD-10-CM | POA: Insufficient documentation

## 2020-07-04 DIAGNOSIS — R000 Tachycardia, unspecified: Secondary | ICD-10-CM | POA: Insufficient documentation

## 2020-07-04 DIAGNOSIS — D5700 Hb-SS disease with crisis, unspecified: Secondary | ICD-10-CM

## 2020-07-04 DIAGNOSIS — R9431 Abnormal electrocardiogram [ECG] [EKG]: Secondary | ICD-10-CM

## 2020-07-04 DIAGNOSIS — M545 Low back pain, unspecified: Secondary | ICD-10-CM | POA: Diagnosis present

## 2020-07-04 DIAGNOSIS — R1110 Vomiting, unspecified: Secondary | ICD-10-CM | POA: Diagnosis not present

## 2020-07-04 DIAGNOSIS — F17200 Nicotine dependence, unspecified, uncomplicated: Secondary | ICD-10-CM | POA: Insufficient documentation

## 2020-07-04 LAB — COMPREHENSIVE METABOLIC PANEL
ALT: 26 U/L (ref 0–44)
AST: 59 U/L — ABNORMAL HIGH (ref 15–41)
Albumin: 4.6 g/dL (ref 3.5–5.0)
Alkaline Phosphatase: 112 U/L (ref 38–126)
Anion gap: 10 (ref 5–15)
BUN: 5 mg/dL — ABNORMAL LOW (ref 6–20)
CO2: 22 mmol/L (ref 22–32)
Calcium: 9.4 mg/dL (ref 8.9–10.3)
Chloride: 106 mmol/L (ref 98–111)
Creatinine, Ser: 0.3 mg/dL — ABNORMAL LOW (ref 0.44–1.00)
GFR, Estimated: >60 mL/min (ref >60)
Glucose, Bld: 121 mg/dL — ABNORMAL HIGH (ref 70–99)
Potassium: 3.5 mmol/L (ref 3.5–5.1)
Sodium: 138 mmol/L (ref 135–145)
Total Bilirubin: 3.2 mg/dL — ABNORMAL HIGH (ref 0.3–1.2)
Total Protein: 8.7 g/dL — ABNORMAL HIGH (ref 6.5–8.1)

## 2020-07-04 LAB — CBC WITH DIFFERENTIAL/PLATELET
Abs Immature Granulocytes: 0.12 10*3/uL — ABNORMAL HIGH (ref 0.00–0.07)
Basophils Absolute: 0 10*3/uL (ref 0.0–0.1)
Basophils Relative: 0 %
Eosinophils Absolute: 0 10*3/uL (ref 0.0–0.5)
Eosinophils Relative: 0 %
HCT: 23.2 % — ABNORMAL LOW (ref 36.0–46.0)
Hemoglobin: 8.3 g/dL — ABNORMAL LOW (ref 12.0–15.0)
Immature Granulocytes: 1 %
Lymphocytes Relative: 9 %
Lymphs Abs: 1.3 10*3/uL (ref 0.7–4.0)
MCH: 31.3 pg (ref 26.0–34.0)
MCHC: 35.8 g/dL (ref 30.0–36.0)
MCV: 87.5 fL (ref 80.0–100.0)
Monocytes Absolute: 0.7 10*3/uL (ref 0.1–1.0)
Monocytes Relative: 5 %
Neutro Abs: 13 10*3/uL — ABNORMAL HIGH (ref 1.7–7.7)
Neutrophils Relative %: 85 %
Platelets: 355 10*3/uL (ref 150–400)
RBC: 2.65 MIL/uL — ABNORMAL LOW (ref 3.87–5.11)
RDW: 21.1 % — ABNORMAL HIGH (ref 11.5–15.5)
WBC: 15.2 10*3/uL — ABNORMAL HIGH (ref 4.0–10.5)
nRBC: 2.4 % — ABNORMAL HIGH (ref 0.0–0.2)

## 2020-07-04 LAB — RETICULOCYTES
Immature Retic Fract: 13.3 % (ref 2.3–15.9)
RBC.: 2.66 MIL/uL — ABNORMAL LOW (ref 3.87–5.11)
Retic Count, Absolute: 267 10*3/uL — ABNORMAL HIGH (ref 19.0–186.0)
Retic Ct Pct: 10.3 % — ABNORMAL HIGH (ref 0.4–3.1)

## 2020-07-04 LAB — I-STAT BETA HCG BLOOD, ED (MC, WL, AP ONLY): I-stat hCG, quantitative: <5 m[IU]/mL (ref <5)

## 2020-07-04 MED ORDER — PROCHLORPERAZINE EDISYLATE 10 MG/2ML IJ SOLN
10.0000 mg | Freq: Once | INTRAMUSCULAR | Status: AC
  Administered 2020-07-04: 10 mg via INTRAVENOUS
  Filled 2020-07-04: qty 2

## 2020-07-04 MED ORDER — HYDROMORPHONE 1 MG/ML IV SOLN
INTRAVENOUS | Status: DC
  Administered 2020-07-04: 30 mg via INTRAVENOUS
  Administered 2020-07-04: 2 mg via INTRAVENOUS
  Filled 2020-07-04: qty 30

## 2020-07-04 MED ORDER — DIPHENHYDRAMINE HCL 25 MG PO CAPS: 25.0000 mg | ORAL_CAPSULE | ORAL | Status: DC | PRN

## 2020-07-04 MED ORDER — KETOROLAC TROMETHAMINE 15 MG/ML IJ SOLN
15.0000 mg | INTRAMUSCULAR | Status: AC
  Administered 2020-07-04: 15 mg via INTRAVENOUS
  Filled 2020-07-04: qty 1

## 2020-07-04 MED ORDER — DIPHENHYDRAMINE HCL 50 MG/ML IJ SOLN: 12.5000 mg | Freq: Four times a day (QID) | INTRAMUSCULAR | Status: DC | PRN

## 2020-07-04 MED ORDER — ONDANSETRON HCL 4 MG/2ML IJ SOLN
4.0000 mg | Freq: Four times a day (QID) | INTRAMUSCULAR | Status: DC | PRN
  Filled 2020-07-04: qty 2

## 2020-07-04 MED ORDER — DIPHENHYDRAMINE HCL 12.5 MG/5ML PO ELIX: 12.5000 mg | ORAL_SOLUTION | Freq: Four times a day (QID) | ORAL | Status: DC | PRN

## 2020-07-04 MED ORDER — SODIUM CHLORIDE 0.9% FLUSH: 9.0000 mL | INTRAVENOUS | Status: DC | PRN

## 2020-07-04 MED ORDER — ONDANSETRON 4 MG PO TBDP: 4.0000 mg | ORAL_TABLET | Freq: Three times a day (TID) | ORAL | 0 refills | Status: DC | PRN

## 2020-07-04 MED ORDER — ONDANSETRON HCL 4 MG/2ML IJ SOLN
4.0000 mg | INTRAMUSCULAR | Status: DC | PRN
  Administered 2020-07-04: 4 mg via INTRAVENOUS
  Filled 2020-07-04: qty 2

## 2020-07-04 MED ORDER — SODIUM CHLORIDE 0.9 % IV SOLN
12.5000 mg | Freq: Four times a day (QID) | INTRAVENOUS | Status: DC | PRN
  Administered 2020-07-04: 12.5 mg via INTRAVENOUS
  Filled 2020-07-04: qty 0.5
  Filled 2020-07-04: qty 12.5

## 2020-07-04 MED ORDER — DIPHENHYDRAMINE HCL 50 MG/ML IJ SOLN
12.5000 mg | Freq: Once | INTRAMUSCULAR | Status: AC
  Administered 2020-07-04: 12.5 mg via INTRAVENOUS
  Filled 2020-07-04: qty 1

## 2020-07-04 MED ORDER — SODIUM CHLORIDE 0.45 % IV SOLN: INTRAVENOUS | Status: DC

## 2020-07-04 MED ORDER — HYDROMORPHONE HCL 2 MG/ML IJ SOLN
2.0000 mg | INTRAMUSCULAR | Status: AC
  Administered 2020-07-04: 2 mg via INTRAVENOUS
  Filled 2020-07-04 (×2): qty 1

## 2020-07-04 MED ORDER — ACETAMINOPHEN 500 MG PO TABS
1000.0000 mg | ORAL_TABLET | Freq: Once | ORAL | Status: AC
  Administered 2020-07-04: 1000 mg via ORAL
  Filled 2020-07-04: qty 2

## 2020-07-04 MED ORDER — KETOROLAC TROMETHAMINE 30 MG/ML IJ SOLN
15.0000 mg | Freq: Once | INTRAMUSCULAR | Status: AC
  Administered 2020-07-04: 15 mg via INTRAVENOUS
  Filled 2020-07-04: qty 1

## 2020-07-04 MED ORDER — NALOXONE HCL 0.4 MG/ML IJ SOLN: 0.4000 mg | INTRAMUSCULAR | Status: DC | PRN

## 2020-07-04 MED ORDER — HYDROMORPHONE HCL 2 MG/ML IJ SOLN
2.0000 mg | INTRAMUSCULAR | Status: AC
Start: 2020-07-04 — End: 2020-07-04
  Administered 2020-07-04: 2 mg via INTRAVENOUS

## 2020-07-04 NOTE — H&P (Signed)
[facility] History and Physical   Date: 07/04/2020  Patient name: Brooke Patterson Medical record number: [id number] Date of birth: September 16, 2000 Age: 20 y.o. Gender: female PCP: Willey Blade, MD  Attending physician: Quentin Angst, MD  Chief Complaint: Sickle cell pain   History of Present Illness: Brooke Patterson is a 20 year old female with a medical history significant for sickle cell disease that presents with complaints of headache, low back, and lower extremity pain that is consistent with her typical sickle cell crisis.  Patient was in usual state of health and developed sudden low back and lower extremity pain while at work yesterday.  Patient immediately reported to the emergency department, was given pain medications and discharged home.  She says that pain was controlled at that time.  However, pain returned and patient came back to the emergency department.  Agree with ER provider that patient was appropriate to transition to day clinic for further pain management and extended observation.  Patient rates her pain as 6/10 and characterizes it as aching.  She denies any blurred vision, dizziness, paresthesias, or recent falls.  No sore throat, persistent cough, fever, or chills.  No urinary symptoms, nausea, vomiting, or diarrhea.  Meds: Medications Prior to Admission  Medication Sig Dispense Refill Last Dose   amoxicillin-clavulanate (AUGMENTIN) 875-125 MG tablet Take 1 tablet by mouth every 12 (twelve) hours. 7 tablet 0    escitalopram (LEXAPRO) 5 MG tablet Take 5 mg by mouth daily.      folic acid (FOLVITE) 1 MG tablet Take 1 tablet (1 mg total) by mouth daily. (Patient not taking: Reported on 06/11/2020) 30 tablet 11    ibuprofen (ADVIL) 800 MG tablet Take 1 tablet (800 mg total) by mouth every 8 (eight) hours as needed. (Patient taking differently: Take 800 mg by mouth every 8 (eight) hours as needed for mild pain.) 30 tablet 0    oxyCODONE 10 MG TABS Take  0.5 tablets (5 mg total) by mouth every 6 (six) hours as needed. (Patient taking differently: Take 5 mg by mouth every 6 (six) hours as needed (pain).) 20 tablet 0     Allergies: Acetaminophen and Morphine and related Past Medical History:  Diagnosis Date   Sickle cell anemia (HCC)    No past surgical history on file. Family History  Family history unknown: Yes   Social History   Socioeconomic History   Marital status: Single    Spouse name: Not on file   Number of children: Not on file   Years of education: Not on file   Highest education level: Not on file  Occupational History   Not on file  Tobacco Use   Smoking status: Every Day    Pack years: 0.00   Smokeless tobacco: Never  Substance and Sexual Activity   Alcohol use: Yes   Drug use: Yes    Types: Marijuana   Sexual activity: Not on file  Other Topics Concern   Not on file  Social History Narrative   Not on file   Social Determinants of Health   Financial Resource Strain: Not on file  Food Insecurity: Not on file  Transportation Needs: Not on file  Physical Activity: Not on file  Stress: Not on file  Social Connections: Not on file  Intimate Partner Violence: Not on file   Review of Systems  Constitutional:  Negative for chills and fever.  HENT: Negative.    Eyes: Negative.   Respiratory: Negative.  Cardiovascular: Negative.   Gastrointestinal: Negative.   Genitourinary: Negative.   Musculoskeletal:  Positive for back pain and joint pain.  Skin: Negative.   Neurological: Negative.   Psychiatric/Behavioral: Negative.     Physical Exam: Blood pressure 119/75, pulse 94, temperature 99.1 F (37.3 C), temperature source Oral, resp. rate 18, last menstrual period 06/25/2020, SpO2 94 %.  Physical Exam Constitutional:      Appearance: Normal appearance.  Eyes:     Pupils: Pupils are equal, round, and reactive to light.  Cardiovascular:     Rate and Rhythm: Normal rate and regular rhythm.      Pulses: Normal pulses.  Pulmonary:     Effort: Pulmonary effort is normal.  Abdominal:     General: Abdomen is flat. Bowel sounds are normal.  Musculoskeletal:        General: Normal range of motion.  Skin:    General: Skin is warm.  Neurological:     General: No focal deficit present.     Mental Status: She is alert. Mental status is at baseline.  Psychiatric:        Mood and Affect: Mood normal.        Thought Content: Thought content normal.        Judgment: Judgment normal.    Lab results: Results for orders placed or performed during the hospital encounter of 07/04/20 (from the past 24 hour(s))  Comprehensive metabolic panel     Status: Abnormal   Collection Time: 07/04/20  2:15 AM  Result Value Ref Range   Sodium 138 135 - 145 mmol/L   Potassium 3.5 3.5 - 5.1 mmol/L   Chloride 106 98 - 111 mmol/L   CO2 22 22 - 32 mmol/L   Glucose, Bld 121 (H) 70 - 99 mg/dL   BUN 5 (L) 6 - 20 mg/dL   Creatinine, Ser 4.16 (L) 0.44 - 1.00 mg/dL   Calcium 9.4 8.9 - 60.6 mg/dL   Total Protein 8.7 (H) 6.5 - 8.1 g/dL   Albumin 4.6 3.5 - 5.0 g/dL   AST 59 (H) 15 - 41 U/L   ALT 26 0 - 44 U/L   Alkaline Phosphatase 112 38 - 126 U/L   Total Bilirubin 3.2 (H) 0.3 - 1.2 mg/dL   GFR, Estimated >30 >16 mL/min   Anion gap 10 5 - 15  CBC with Differential     Status: Abnormal   Collection Time: 07/04/20  2:15 AM  Result Value Ref Range   WBC 15.2 (H) 4.0 - 10.5 K/uL   RBC 2.65 (L) 3.87 - 5.11 MIL/uL   Hemoglobin 8.3 (L) 12.0 - 15.0 g/dL   HCT 01.0 (L) 93.2 - 35.5 %   MCV 87.5 80.0 - 100.0 fL   MCH 31.3 26.0 - 34.0 pg   MCHC 35.8 30.0 - 36.0 g/dL   RDW 73.2 (H) 20.2 - 54.2 %   Platelets 355 150 - 400 K/uL   nRBC 2.4 (H) 0.0 - 0.2 %   Neutrophils Relative % 85 %   Neutro Abs 13.0 (H) 1.7 - 7.7 K/uL   Lymphocytes Relative 9 %   Lymphs Abs 1.3 0.7 - 4.0 K/uL   Monocytes Relative 5 %   Monocytes Absolute 0.7 0.1 - 1.0 K/uL   Eosinophils Relative 0 %   Eosinophils Absolute 0.0 0.0 - 0.5 K/uL    Basophils Relative 0 %   Basophils Absolute 0.0 0.0 - 0.1 K/uL   Immature Granulocytes 1 %   Abs Immature Granulocytes  0.12 (H) 0.00 - 0.07 K/uL   Pappenheimer Bodies PRESENT    Polychromasia PRESENT    Sickle Cells PRESENT    Target Cells PRESENT   Reticulocytes     Status: Abnormal   Collection Time: 07/04/20  2:15 AM  Result Value Ref Range   Retic Ct Pct 10.3 (H) 0.4 - 3.1 %   RBC. 2.66 (L) 3.87 - 5.11 MIL/uL   Retic Count, Absolute 267.0 (H) 19.0 - 186.0 K/uL   Immature Retic Fract 13.3 2.3 - 15.9 %  I-Stat beta hCG blood, ED     Status: None   Collection Time: 07/04/20  2:55 AM  Result Value Ref Range   I-stat hCG, quantitative <5.0 <5 mIU/mL   Comment 3            Imaging results:  DG Chest Portable 1 View  Result Date: 07/04/2020 CLINICAL DATA:  Chest pain, sickle cell anemia EXAM: PORTABLE CHEST 1 VIEW COMPARISON:  06/10/2020 FINDINGS: The heart size and mediastinal contours are within normal limits. Both lungs are clear. The visualized skeletal structures are unremarkable. IMPRESSION: No active disease. Electronically Signed   By: Helyn Numbers MD   On: 07/04/2020 04:14     Assessment & Plan: Patient admitted to sickle cell day infusion center for management of pain crisis.  Patient is opiate naive Initiate IV dilaudid PCA, full dose IV fluids, 0.45% saline at 100 ml/hr Toradol 15 mg IV times one dose Tylenol 1000 mg by mouth times one dose Reviewed laboratory values from ER, did not warrant repeating at this time.  Consistent with patient's baseline. Pain intensity will be reevaluated in context of functioning and relationship to baseline as care progresses If pain intensity remains elevated and/or sudden change in hemodynamic stability transition to inpatient services for higher level of care.      Nolon Nations  APRN, MSN, FNP-C Patient Care [facility] [address] [place], [place] [address] [phone]  07/04/2020,  11:21 AM

## 2020-07-04 NOTE — Discharge Summary (Signed)
[facility] Discharge Summary   Patient ID: Brooke Patterson MRN: [id number] DOB/AGE: 01-27-2000 20 y.o.  Admit date: 07/04/2020 Discharge date: 07/04/2020  Primary Care Physician:  Willey Blade, MD  Admission Diagnoses:  Active Problems:   Sickle cell pain crisis [facility])   Discharge Medications:  Allergies as of 07/04/2020       Reactions   Acetaminophen Nausea And Vomiting   nausea Other reaction(s): Vomiting   Morphine And Related    nausea        Medication List     TAKE these medications    amoxicillin-clavulanate 875-125 MG tablet Commonly known as: AUGMENTIN Take 1 tablet by mouth every 12 (twelve) hours.   escitalopram 5 MG tablet Commonly known as: LEXAPRO Take 5 mg by mouth daily.       ASK your doctor about these medications    folic acid 1 MG tablet Commonly known as: FOLVITE Take 1 tablet (1 mg total) by mouth daily.   ibuprofen 800 MG tablet Commonly known as: ADVIL Take 1 tablet (800 mg total) by mouth every 8 (eight) hours as needed.   Oxycodone HCl 10 MG Tabs Take 0.5 tablets (5 mg total) by mouth every 6 (six) hours as needed.         Consults:  None  Significant Diagnostic Studies:  DG Chest 2 View  Result Date: 06/10/2020 CLINICAL DATA:  Sickle cell crisis, posterior chest/rib pain in the mid back, current smoker EXAM: CHEST - 2 VIEW COMPARISON:  None. FINDINGS: Coarsened interstitial changes may reflect chronic lung sequela in a sickle cell patient. Some more patchy and bandlike opacities present along the left hemidiaphragm which could reflect scarring versus an acute airspace process. No other focal airspace disease. No pneumothorax or visible effusion. No mediastinal fluid or gas. Normal thyroid gland and thoracic inlet. No acute abnormality of the trachea or esophagus. No worrisome mediastinal, hilar or axillary adenopathy. Mild osseous manifestations of sickle cell disease. IMPRESSION: Opacity in the left lung  base could reflect scarring or more acute airspace process of in a patient with history of sickle cell and acute onset chest pain. Chronically coarsened interstitial changes. Mild osseous manifestations of sickle cell. Electronically Signed   By: Kreg Shropshire M.D.   On: 06/10/2020 17:18   DG Chest Portable 1 View  Result Date: 07/04/2020 CLINICAL DATA:  Chest pain, sickle cell anemia EXAM: PORTABLE CHEST 1 VIEW COMPARISON:  06/10/2020 FINDINGS: The heart size and mediastinal contours are within normal limits. Both lungs are clear. The visualized skeletal structures are unremarkable. IMPRESSION: No active disease. Electronically Signed   By: Helyn Numbers MD   On: 07/04/2020 04:14    History of present illness:  Brooke Patterson is a 20 year old female with a medical history significant for sickle cell disease that presents with complaints of headache, low back, and lower extremity pain that is consistent with her typical sickle cell crisis.  Patient was in usual state of health and developed sudden low back and lower extremity pain while at work yesterday.  Patient immediately reported to the emergency department, was given pain medications and discharged home.  She says that pain was controlled at that time.  However, pain returned and patient came back to the emergency department.  Agree with ER provider that patient was appropriate to transition to day clinic for further pain management and extended observation.  Patient rates her pain as 6/10 and characterizes it as aching.  She denies any blurred vision,  dizziness, paresthesias, or recent falls.  No sore throat, persistent cough, fever, or chills.  No urinary symptoms, nausea, vomiting, or diarrhea. [facility] Course: Patient admitted to sickle cell day infusion clinic for pain management and extended observation.  Patient transition from ER in stable condition. Reviewed laboratory values, did not warrant repeating at this time and are  largely consistent with patient's baseline. Pain managed with IV Dilaudid PCA per full dose Toradol 15 mg IV x1 Promethazine 12.5 mg x 1 Tylenol 1000 mg x 1 0.45% saline at 150 mL/h Pain intensity decreased to 2/10 and patient is requesting discharge home.  Patient is alert, oriented, and ambulating without assistance.  She will discharge home in a hemodynamically stable condition.  Discharge instructions: Resume all home medications.   Follow up with PCP as previously  scheduled.   Discussed the importance of drinking 64 ounces of water daily, dehydration of red blood cells may lead further sickling.   Avoid all stressors that precipitate sickle cell pain crisis.     The patient was given clear instructions to go to ER or return to medical center if symptoms do not improve, worsen or new problems develop.   Physical Exam at Discharge:  BP (!) 98/52 (BP Location: Right Arm)   Pulse 72   Temp 99.1 F (37.3 C) (Oral)   Resp 18   LMP 06/25/2020 (Exact Date)   SpO2 94%   Physical Exam Constitutional:      Appearance: Normal appearance.  Cardiovascular:     Rate and Rhythm: Normal rate and regular rhythm.     Pulses: Normal pulses.  Abdominal:     General: Abdomen is flat. Bowel sounds are normal.  Musculoskeletal:        General: Normal range of motion.  Skin:    General: Skin is warm.  Neurological:     General: No focal deficit present.     Mental Status: She is alert. Mental status is at baseline.  Psychiatric:        Mood and Affect: Mood normal.        Thought Content: Thought content normal.        Judgment: Judgment normal.     Disposition at Discharge: Discharge disposition: 01-Home or Self Care       Discharge Orders:   Condition at Discharge:   Stable  Time spent on Discharge:  Greater than 30 minutes.  Signed: Nolon Nations  APRN, MSN, FNP-C Patient Care [facility] [address] [place], [place]  [address] [phone]  07/04/2020, 3:17 PM

## 2020-07-04 NOTE — ED Provider Notes (Signed)
Care of the patient received from [facility].  Please see her note for full HPI.  In short, 20 year old female with presents to the ER with sickle cell crisis.  She reports low back pain and throbbing pain in her chest as well as a headache.  Work-up overall reassuring, not suggestive of CVA, venous thrombosis, meningitis, PE, ACS, acute chest.  Patient had driven herself to the ER.  Prior provider did not feel safe for the patient to drive given multiple rounds of Dilaudid, Benadryl.  She had originally mentioned a ride share however prior provider encouraged her to stay to metabolize the medications.  On my reevaluation, patient reports overall improvement in pain, but still persistent of a headache.  She will try to contact her sister for possible ride home.  I reviewed the patient's medications that she had seen here in the ER, Toradol, Compazine, Dilaudid.  Patient is listed allergy to Tylenol.  Unfortunately we have exhausted most of the options for headache especially in the setting of prolonged QT.  Discussed this with Dr. Bernette Mayers who agrees.  I discussed this with the patient who would like to be admitted for further pain management.  Sickle cell hospitalist team consulted.  Discussed the patient's care with [place] Hollis NP.  I do think that the patient would be a good candidate for the day hospital.  Patient with continued complaint of headache, no focal neuro deficits on my exam, low suspicion for stroke, space-occupying lesion, head bleed.  Plan for discharge and transfer to the day hospital for further treatment.  Patient is agreeable to this     Leone Brand 07/04/20 1032    Pollyann Savoy, MD 07/04/20 [phone]

## 2020-07-04 NOTE — Progress Notes (Signed)
Patient admitted to the day hospital from the ER for treatment of sickle cell pain crisis. Patient reported pain rated 7/10 in the head. Patient placed on Dilaudid PCA, given IV Phenergan, PO Tylenol and hydrated with IV fluids. At discharge patient reported pain at 2/10. Declined printed AVS. On completion, IV site was a little swollen. Per patient, it swells like that sometimes whenever she gets an IV. Per patient it "doesn't hurt". Per provider, patient told to intermittently use a cold and hot compress on the site. Also, told to go to the ER if it's necessary.  Alert, oriented and ambulatory at discharge.

## 2020-07-04 NOTE — ED Notes (Signed)
Provided pt with cup of water. Pt tolerated water intake.

## 2020-07-04 NOTE — ED Provider Notes (Signed)
[facility]-EMERGENCY DEPT Provider Note   CSN: [id number] Arrival date & time: 07/04/20  0155     History Chief Complaint  Patient presents with   Sickle Cell Pain Crisis    Brooke Patterson is a 20 y.o. female with a history of sickle cell anemia, UTI, pneumonia who presents the emergency department with a chief complaint of sickle cell pain crisis.  The patient reports sickle cell pain crisis that began approximately 24 hours ago.  She states that she is having pain in her low back and throbbing pain in her chest near her sternum.  She also reports that she has developed a headache that began this afternoon.  Headache is located at the top of her head and began suddenly.  She reports associated photophobia.  Around 1400, the patient took a dose of her home oxycodone and subsequently developed vomiting.  Reports that she has had countless episodes of vomiting since onset.  She reports that this is not unusual for her to start having vomiting after taking oxycodone.  She reports decreased p.o. intake and urine output since onset of vomiting.  No history of headaches with sickle cell pain crisis.  She denies shortness of breath, numbness, weakness, fever, chills, neck pain or stiffness, visual changes, diaphoresis, leg swelling, cough, nasal congestion, abdominal pain, diarrhea, dysuria, hematuria.   .    The history is provided by medical records and the patient. No language interpreter was used.      Past Medical History:  Diagnosis Date   Sickle cell anemia (HCC)     Patient Active Problem List   Diagnosis Date Noted   Community acquired pneumonia of left lower lobe of lung 06/11/2020   E-coli UTI 04/11/2020   Acute cystitis without hematuria    Sickle cell pain crisis (HCC) 04/08/2020    History reviewed. No pertinent surgical history.   OB History   No obstetric history on file.     Family History  Family history unknown: Yes    Social  History   Tobacco Use   Smoking status: Every Day    Pack years: 0.00   Smokeless tobacco: Never  Substance Use Topics   Alcohol use: Yes   Drug use: Yes    Types: Marijuana    Home Medications Prior to Admission medications   Medication Sig Start Date End Date Taking? Authorizing Provider  ondansetron (ZOFRAN ODT) 4 MG disintegrating tablet Take 1 tablet (4 mg total) by mouth every 8 (eight) hours as needed. 07/04/20  Yes Rayssa Atha A, PA-C  amoxicillin-clavulanate (AUGMENTIN) 875-125 MG tablet Take 1 tablet by mouth every 12 (twelve) hours. 06/15/20   Rometta Emery, MD  escitalopram (LEXAPRO) 5 MG tablet Take 5 mg by mouth daily. 06/06/20   [provider]  folic acid (FOLVITE) 1 MG tablet Take 1 tablet (1 mg total) by mouth daily. Patient not taking: Reported on 06/11/2020 04/11/20 04/11/21  Massie Maroon, FNP  ibuprofen (ADVIL) 800 MG tablet Take 1 tablet (800 mg total) by mouth every 8 (eight) hours as needed. Patient taking differently: Take 800 mg by mouth every 8 (eight) hours as needed for mild pain. 04/11/20   Massie Maroon, FNP  oxyCODONE 10 MG TABS Take 0.5 tablets (5 mg total) by mouth every 6 (six) hours as needed. Patient taking differently: Take 5 mg by mouth every 6 (six) hours as needed (pain). 04/11/20   Massie Maroon, FNP    Allergies  Acetaminophen and Morphine and related  Review of Systems   Review of Systems  Constitutional:  Negative for activity change, chills and fever.  HENT:  Negative for congestion and sore throat.   Eyes:  Negative for visual disturbance.  Respiratory:  Negative for shortness of breath and wheezing.   Cardiovascular:  Positive for chest pain. Negative for palpitations and leg swelling.  Gastrointestinal:  Positive for nausea and vomiting. Negative for abdominal pain, constipation and diarrhea.  Genitourinary:  Positive for hematuria. Negative for dysuria.  Musculoskeletal:  Positive for back pain and myalgias.  Negative for neck pain and neck stiffness.  Skin:  Negative for rash.  Allergic/Immunologic: Negative for immunocompromised state.  Neurological:  Negative for seizures, syncope, weakness, numbness and headaches.  Psychiatric/Behavioral:  Negative for confusion.    Physical Exam Updated Vital Signs BP 124/68   Pulse 96   Temp 99.5 F (37.5 C) (Oral)   Resp 10   Ht 5\' 6"  (1.676 m)   Wt 63.5 kg   LMP 06/25/2020 (Exact Date)   SpO2 (!) 88%   BMI 22.60 kg/m   Physical Exam Vitals and nursing note reviewed.  Constitutional:      General: She is not in acute distress.    Appearance: She is not ill-appearing or toxic-appearing.     Comments: Actively vomiting at bedside  HENT:     Head: Normocephalic.  Eyes:     Extraocular Movements: Extraocular movements intact.     Conjunctiva/sclera: Conjunctivae normal.     Pupils: Pupils are equal, round, and reactive to light.  Cardiovascular:     Rate and Rhythm: Regular rhythm. Tachycardia present.     Pulses: Normal pulses.     Heart sounds: Normal heart sounds. No murmur heard.   No friction rub. No gallop.  Pulmonary:     Effort: Pulmonary effort is normal. No respiratory distress.     Breath sounds: No stridor. No wheezing, rhonchi or rales.     Comments: Reproducible tenderness to palpation over the body of the sternum and xiphoid process. Chest:     Chest wall: Tenderness present.  Abdominal:     General: There is no distension.     Palpations: Abdomen is soft. There is no mass.     Tenderness: There is no abdominal tenderness. There is no right CVA tenderness, left CVA tenderness, guarding or rebound.     Hernia: No hernia is present.     Comments: Abdomen soft, nontender, nondistended.  Musculoskeletal:     Cervical back: Neck supple.     Right lower leg: No edema.     Left lower leg: No edema.  Skin:    General: Skin is warm.     Coloration: Skin is not jaundiced or pale.     Findings: No rash.  Neurological:      Mental Status: She is alert.     Comments: Cranial nerves II through XII are grossly intact.  5 of 5 strength against resistance of the bilateral upper and lower extremities.  Sensation is intact and equal throughout.  Finger-nose is intact bilaterally.  Psychiatric:        Behavior: Behavior normal.    ED Results / Procedures / Treatments   Labs (all labs ordered are listed, but only abnormal results are displayed) Labs Reviewed  COMPREHENSIVE METABOLIC PANEL - Abnormal; Notable for the following components:      Result Value   Glucose, Bld 121 (*)    BUN 5 (*)  Creatinine, Ser 0.30 (*)    Total Protein 8.7 (*)    AST 59 (*)    Total Bilirubin 3.2 (*)    All other components within normal limits  CBC WITH DIFFERENTIAL/PLATELET - Abnormal; Notable for the following components:   WBC 15.2 (*)    RBC 2.65 (*)    Hemoglobin 8.3 (*)    HCT 23.2 (*)    RDW 21.1 (*)    nRBC 2.4 (*)    Neutro Abs 13.0 (*)    Abs Immature Granulocytes 0.12 (*)    All other components within normal limits  RETICULOCYTES - Abnormal; Notable for the following components:   Retic Ct Pct 10.3 (*)    RBC. 2.66 (*)    Retic Count, Absolute 267.0 (*)    All other components within normal limits  I-STAT BETA HCG BLOOD, ED ([facility], [facility], [facility] ONLY)    EKG None  Radiology DG Chest Portable 1 View  Result Date: 07/04/2020 CLINICAL DATA:  Chest pain, sickle cell anemia EXAM: PORTABLE CHEST 1 VIEW COMPARISON:  06/10/2020 FINDINGS: The heart size and mediastinal contours are within normal limits. Both lungs are clear. The visualized skeletal structures are unremarkable. IMPRESSION: No active disease. Electronically Signed   By: Helyn NumbersAshesh  Parikh MD   On: 07/04/2020 04:14    Procedures Procedures   Medications Ordered in ED Medications  ondansetron [facility](ZOFRAN) injection 4 mg (4 mg Intravenous Given 07/04/20 0351)  0.45 % sodium chloride infusion ( Intravenous New Bag/Given 07/04/20 0356)  ketorolac (TORADOL) 15 MG/ML  injection 15 mg (15 mg Intravenous Given 07/04/20 0349)  HYDROmorphone (DILAUDID) injection 2 mg (2 mg Intravenous Given 07/04/20 0351)  HYDROmorphone (DILAUDID) injection 2 mg (2 mg Intravenous Given 07/04/20 0434)  prochlorperazine (COMPAZINE) injection 10 mg (10 mg Intravenous Given 07/04/20 0501)  diphenhydrAMINE (BENADRYL) injection 12.5 mg (12.5 mg Intravenous Given 07/04/20 0502)    ED Course  I have reviewed the triage vital signs and the nursing notes.  Pertinent labs & imaging results that were available during my care of the patient were reviewed by me and considered in my medical decision making (see chart for details).    MDM Rules/Calculators/A&P                           20 year old female with a history of sickle cell anemia, UTI, pneumonia who presents the emergency department with a chief complaint of sickle cell pain crisis.  She is endorsing back pain, chest pain, and headache.  She developed vomiting this afternoon.  Tachycardic to 120 on arrival.  I suspect this is likely from vomiting as the patient states that she has been unable to keep down any food or fluids since this afternoon.  EKG with sinus tachycardia.  She does also have a prolonged QTC.  Discussed with Dr. Jacqulyn BathLong, attending physician.  We will further avoid QTC prolonging agents.  Labs and imaging been reviewed and independently interpreted by me. Hemoglobin is stable at 8.3.  No metabolic derangements.  She does have a leukocytosis of 15, up from yesterday.  However, she has no other infectious symptoms at this time.  Chest x-ray is unremarkable.  On re-evaluation, patient reports significant improvement in headache and chest pain has resolved.  I have a low suspicion for CVA, venous thrombosis, meningitis, intracranial hemorrhage, PE, ACS.  She has been successfully fluid challenge.  Patient not disclose that she has driven herself to the ER.  At this  time, she is still metabolizing Dilaudid and would not be  safe for discharge despite feeling much improved.  She did offer to call a ride share app, but I do not feel comfortable with that plan until she has continued to further metabolize Dilaudid.  Patient care transferred to PA Belaya at the end of my shift to reevaluate the patient after she has metabolized medication. Patient presentation, ED course, and plan of care discussed with review of all pertinent labs and imaging. Please see his/her note for further details regarding further ED course and disposition.   Final Clinical Impression(s) / ED Diagnoses Final diagnoses:  Sickle cell pain crisis [facility])    Rx / DC Orders ED Discharge Orders          Ordered    ondansetron (ZOFRAN ODT) 4 MG disintegrating tablet  Every 8 hours PRN        07/04/20 0641             Barkley Boards, PA-C 07/04/20 5974    Maia Plan, MD 07/05/20 [phone]

## 2020-07-04 NOTE — ED Triage Notes (Signed)
Pt c/o SCC pain. States the pain is in her head and chest. Denies fevers. States she was discharged from [facility] last night but pain has returned.

## 2020-07-04 NOTE — Discharge Instructions (Addendum)
Thank you for allowing me to care for you today in the Emergency Department.   Please continue to follow closely with your sickle cell team.  Return to the emergency department if you develop shortness of breath, if you pass out, develop a severe headache with numbness, weakness, visual changes, or other new, concerning symptoms.  Your EKG today had some changes called a prolonged QTC.  Please follow-up with your primary care team for further evaluation.

## 2020-11-13 ENCOUNTER — Other Ambulatory Visit: Payer: Self-pay

## 2020-11-13 ENCOUNTER — Non-Acute Institutional Stay (HOSPITAL_COMMUNITY)
Admission: RE | Admit: 2020-11-13 | Discharge: 2020-11-13 | Disposition: A | Discharge status: Home / Self Care | Payer: Federal, State, Local not specified - PPO | Source: Ambulatory Visit | Attending: Internal Medicine | Admitting: Internal Medicine

## 2020-11-13 DIAGNOSIS — D571 Sickle-cell disease without crisis: Secondary | ICD-10-CM | POA: Diagnosis not present

## 2020-11-13 MED ORDER — SODIUM CHLORIDE 0.45 % IV BOLUS
1000.0000 mL | Freq: Once | INTRAVENOUS | Status: AC
  Administered 2020-11-13: 1000 mL via INTRAVENOUS

## 2020-11-13 NOTE — Progress Notes (Signed)
PATIENT CARE CENTER NOTE   Diagnosis: Sickle Cell Anemia    Provider: Hollis, [place], FNP   Procedure: IV fluid hydration    Note:  Patient recently had sickle cell pain crisis and wanted hydration to prevent repeat crisis.Patient received 1 liter 0.45% NS via PIV. Fluids infused over 1 hour. Patient tolerated well. Vital signs stable.   AVS offered but patient refused. Patient alert, oriented and ambulatory at discharge.

## 2021-04-08 ENCOUNTER — Other Ambulatory Visit: Payer: Self-pay

## 2021-04-08 ENCOUNTER — Encounter (HOSPITAL_BASED_OUTPATIENT_CLINIC_OR_DEPARTMENT_OTHER): Payer: Self-pay | Admitting: *Deleted

## 2021-04-08 ENCOUNTER — Emergency Department (HOSPITAL_BASED_OUTPATIENT_CLINIC_OR_DEPARTMENT_OTHER)
Admission: EM | Admit: 2021-04-08 | Discharge: 2021-04-08 | Disposition: A | Discharge status: Home / Self Care | Payer: Federal, State, Local not specified - PPO | Attending: Emergency Medicine | Admitting: Emergency Medicine

## 2021-04-08 DIAGNOSIS — D72829 Elevated white blood cell count, unspecified: Secondary | ICD-10-CM | POA: Insufficient documentation

## 2021-04-08 DIAGNOSIS — D5700 Hb-SS disease with crisis, unspecified: Secondary | ICD-10-CM

## 2021-04-08 DIAGNOSIS — R0981 Nasal congestion: Secondary | ICD-10-CM | POA: Insufficient documentation

## 2021-04-08 DIAGNOSIS — D57219 Sickle-cell/Hb-C disease with crisis, unspecified: Secondary | ICD-10-CM | POA: Diagnosis present

## 2021-04-08 LAB — COMPREHENSIVE METABOLIC PANEL
ALT: 23 U/L (ref 0–44)
AST: 39 U/L (ref 15–41)
Albumin: 4.6 g/dL (ref 3.5–5.0)
Alkaline Phosphatase: 71 U/L (ref 38–126)
Anion gap: 8 (ref 5–15)
BUN: 6 mg/dL (ref 6–20)
CO2: 23 mmol/L (ref 22–32)
Calcium: 9.7 mg/dL (ref 8.9–10.3)
Chloride: 109 mmol/L (ref 98–111)
Creatinine, Ser: 0.32 mg/dL — ABNORMAL LOW (ref 0.44–1.00)
GFR, Estimated: >60 mL/min (ref >60)
Glucose, Bld: 93 mg/dL (ref 70–99)
Potassium: 4 mmol/L (ref 3.5–5.1)
Sodium: 140 mmol/L (ref 135–145)
Total Bilirubin: 3.1 mg/dL — ABNORMAL HIGH (ref 0.3–1.2)
Total Protein: 7.5 g/dL (ref 6.5–8.1)

## 2021-04-08 LAB — CBC WITH DIFFERENTIAL/PLATELET
Abs Immature Granulocytes: 0.07 10*3/uL (ref 0.00–0.07)
Basophils Absolute: 0.1 10*3/uL (ref 0.0–0.1)
Basophils Relative: 1 %
Eosinophils Absolute: 0.7 10*3/uL — ABNORMAL HIGH (ref 0.0–0.5)
Eosinophils Relative: 6 %
HCT: 22.7 % — ABNORMAL LOW (ref 36.0–46.0)
Hemoglobin: 8.2 g/dL — ABNORMAL LOW (ref 12.0–15.0)
Immature Granulocytes: 1 %
Lymphocytes Relative: 43 %
Lymphs Abs: 5.5 10*3/uL — ABNORMAL HIGH (ref 0.7–4.0)
MCH: 30.7 pg (ref 26.0–34.0)
MCHC: 36.1 g/dL — ABNORMAL HIGH (ref 30.0–36.0)
MCV: 85 fL (ref 80.0–100.0)
Monocytes Absolute: 0.8 10*3/uL (ref 0.1–1.0)
Monocytes Relative: 6 %
Neutro Abs: 5.3 10*3/uL (ref 1.7–7.7)
Neutrophils Relative %: 43 %
Platelets: 320 10*3/uL (ref 150–400)
RBC: 2.67 MIL/uL — ABNORMAL LOW (ref 3.87–5.11)
RDW: 23.1 % — ABNORMAL HIGH (ref 11.5–15.5)
WBC: 12.4 10*3/uL — ABNORMAL HIGH (ref 4.0–10.5)
nRBC: 0.8 % — ABNORMAL HIGH (ref 0.0–0.2)

## 2021-04-08 LAB — RETICULOCYTES
Immature Retic Fract: 27 % — ABNORMAL HIGH (ref 2.3–15.9)
RBC.: 2.66 MIL/uL — ABNORMAL LOW (ref 3.87–5.11)
Retic Count, Absolute: 226.6 10*3/uL — ABNORMAL HIGH (ref 19.0–186.0)
Retic Ct Pct: 8.5 % — ABNORMAL HIGH (ref 0.4–3.1)

## 2021-04-08 MED ORDER — OXYCODONE HCL 10 MG PO TABS: 5.0000 mg | ORAL_TABLET | Freq: Four times a day (QID) | ORAL | 0 refills | Status: AC | PRN

## 2021-04-08 MED ORDER — KETOROLAC TROMETHAMINE 15 MG/ML IJ SOLN
15.0000 mg | INTRAMUSCULAR | Status: AC
  Administered 2021-04-08: 15 mg via INTRAVENOUS
  Filled 2021-04-08: qty 1

## 2021-04-08 MED ORDER — MORPHINE SULFATE (PF) 4 MG/ML IV SOLN
8.0000 mg | INTRAVENOUS | Status: AC
  Administered 2021-04-08: 8 mg via INTRAVENOUS
  Filled 2021-04-08: qty 2

## 2021-04-08 MED ORDER — ONDANSETRON HCL 4 MG/2ML IJ SOLN
4.0000 mg | INTRAMUSCULAR | Status: DC | PRN
  Administered 2021-04-08: 4 mg via INTRAVENOUS
  Filled 2021-04-08: qty 2

## 2021-04-08 MED ORDER — MORPHINE SULFATE (PF) 4 MG/ML IV SOLN
6.0000 mg | INTRAVENOUS | Status: AC
  Administered 2021-04-08: 6 mg via INTRAVENOUS
  Filled 2021-04-08: qty 2

## 2021-04-08 NOTE — ED Provider Notes (Signed)
?[facility] EMERGENCY DEPT ?Provider Note ? ? ?CSN: [id number] ?Arrival date & time: 04/08/21  [id number] ? ?  ? ?History ? ?Chief Complaint  ?Patient presents with  ? Sickle Cell Pain Crisis  ? ? ?Brooke Patterson is a 21 y.o. female. ? ?The history is provided by the patient.  ?Sickle Cell Pain Crisis ?She has history of sickle cell disease and comes in with pain in her lower back which started yesterday.  This is typical of her sickle cell crises.  She has had ibuprofen at home which has been giving her slight but not complete relief.  She states this is her first crisis in about the last 9 months.  She denies fever, chills, sweats.  She denies any cough but has had some nasal congestion.  She also is concerned that she might have a UTI and that her urine has been foul-smelling.  She denies urinary urgency, frequency, tenesmus, dysuria. ?  ?Home Medications ?Prior to Admission medications   ?Medication Sig Start Date End Date Taking? Authorizing Provider  ?amoxicillin-clavulanate (AUGMENTIN) 875-125 MG tablet Take 1 tablet by mouth every 12 (twelve) hours. 06/15/20   Elwyn Reach, MD  ?escitalopram (LEXAPRO) 5 MG tablet Take 5 mg by mouth daily. 06/06/20   [provider]  ?folic acid (FOLVITE) 1 MG tablet Take 1 tablet (1 mg total) by mouth daily. ?Patient not taking: Reported on 06/11/2020 04/11/20 04/11/21  Dorena Dew, FNP  ?ibuprofen (ADVIL) 800 MG tablet Take 1 tablet (800 mg total) by mouth every 8 (eight) hours as needed. ?Patient taking differently: Take 800 mg by mouth every 8 (eight) hours as needed for mild pain. 04/11/20   Dorena Dew, FNP  ?oxyCODONE 10 MG TABS Take 0.5 tablets (5 mg total) by mouth every 6 (six) hours as needed. ?Patient taking differently: Take 5 mg by mouth every 6 (six) hours as needed (pain). 04/11/20   Dorena Dew, FNP  ?   ? ?Allergies    ?Acetaminophen and Morphine and related   ? ?Review of Systems   ?Review of Systems  ?All other systems  reviewed and are negative. ? ?Physical Exam ?Updated Vital Signs ?BP (!) 123/96 (BP Location: Left Arm)   Pulse 66   Temp 98 ?F (36.7 ?C)   Resp 12   Ht 5\' 6"  (1.676 m)   Wt 59 kg   LMP 03/11/2021   SpO2 98%   BMI 20.98 kg/m?  ?Physical Exam ?Vitals and nursing note reviewed.  ?21 year old female, resting comfortably and in no acute distress. Vital signs are significant for mildly elevated diastolic blood pressure. Oxygen saturation is 98%, which is normal. ?Head is normocephalic and atraumatic. PERRLA, EOMI. Oropharynx is clear. ?Neck is nontender and supple without adenopathy or JVD. ?Back is nontender and there is no CVA tenderness. ?Lungs are clear without rales, wheezes, or rhonchi. ?Chest is nontender. ?Heart has regular rate and rhythm without murmur. ?Abdomen is soft, flat, nontender. ?Extremities have no cyanosis or edema, full range of motion is present. ?Skin is warm and dry without rash. ?Neurologic: Mental status is normal, cranial nerves are intact, moves all extremities equally. ? ?ED Results / Procedures / Treatments   ?Labs ?(all labs ordered are listed, but only abnormal results are displayed) ?Labs Reviewed  ?CBC WITH DIFFERENTIAL/PLATELET - Abnormal; Notable for the following components:  ?    Result Value  ? WBC 12.4 (*)   ? RBC 2.67 (*)   ? Hemoglobin 8.2 (*)   ?  HCT 22.7 (*)   ? MCHC 36.1 (*)   ? RDW 23.1 (*)   ? nRBC 0.8 (*)   ? All other components within normal limits  ?RETICULOCYTES - Abnormal; Notable for the following components:  ? Retic Ct Pct 8.5 (*)   ? RBC. 2.66 (*)   ? Retic Count, Absolute 226.6 (*)   ? Immature Retic Fract 27.0 (*)   ? All other components within normal limits  ?COMPREHENSIVE METABOLIC PANEL  ?PREGNANCY, URINE  ? ?Procedures ?Procedures  ? ? ?Medications Ordered in ED ?Medications  ?ondansetron (ZOFRAN) injection 4 mg (4 mg Intravenous Given 04/08/21 0229)  ?morphine (PF) 4 MG/ML injection 6 mg (6 mg Intravenous Given 04/08/21 0229)  ?morphine (PF) 4 MG/ML  injection 8 mg (8 mg Intravenous Given 04/08/21 0306)  ?ketorolac (TORADOL) 15 MG/ML injection 15 mg (15 mg Intravenous Given 04/08/21 0229)  ? ? ?ED Course/ Medical Decision Making/ A&P ?  ?                        ?Medical Decision Making ?Amount and/or Complexity of Data Reviewed ?Labs: ordered. ? ?Risk ?Prescription drug management. ? ? ?Sickle cell anemia with pain crisis.  Possible UTI as precipitating factor.  Screening labs are obtained and hemoglobin is stable, bilirubin is stable.  Mild leukocytosis is present and is nonspecific.  Reticulocyte count is appropriate to the level of anemia.  She feels she is dehydrated, so she is given some IV fluids and will check urinalysis.  She is started on sickle cell protocol with morphine and ketorolac given for pain.  Old records are reviewed showing multiple ED visits and hospitalizations for sickle cell crises, but last ED visit was in June 2022. ? ?She had excellent relief of pain with above-noted treatment and is felt to be safe for discharge.  She is discharged with a prescription for a 3-day supply of oxycodone, told to continue using ibuprofen as primary analgesic.  Follow-up with PCP, return precautions discussed. ? ? ? ? ? ? ? ?Final Clinical Impression(s) / ED Diagnoses ?Final diagnoses:  ?Sickle cell pain crisis ([place])  ? ? ?Rx / DC Orders ?ED Discharge Orders   ? ?      Ordered  ?  Oxycodone HCl 10 MG TABS  Every 6 hours PRN       ? 04/08/21 0354  ? ?  ?  ? ?  ? ? ?  ?Delora Fuel, MD ?[id number] [phone] ? ?

## 2021-04-08 NOTE — ED Triage Notes (Signed)
Pt states pain in the lower back since around 1400. Hx of sickle cell. Last ibuprofen around 2000. Last SS crisis around 8 months ago.  ?

## 2021-04-08 NOTE — Discharge Instructions (Signed)
Return if pain is not being adequately controlled at home, or if you start running a fever. 

## 2022-09-08 IMAGING — DX DG CHEST 1V PORT
1 series · 1 of 1 positions shown · non-contrast
Comparison: 06/10/2020

CLINICAL DATA: Chest pain, sickle cell anemia

EXAM:
PORTABLE CHEST 1 VIEW

[chest ap]
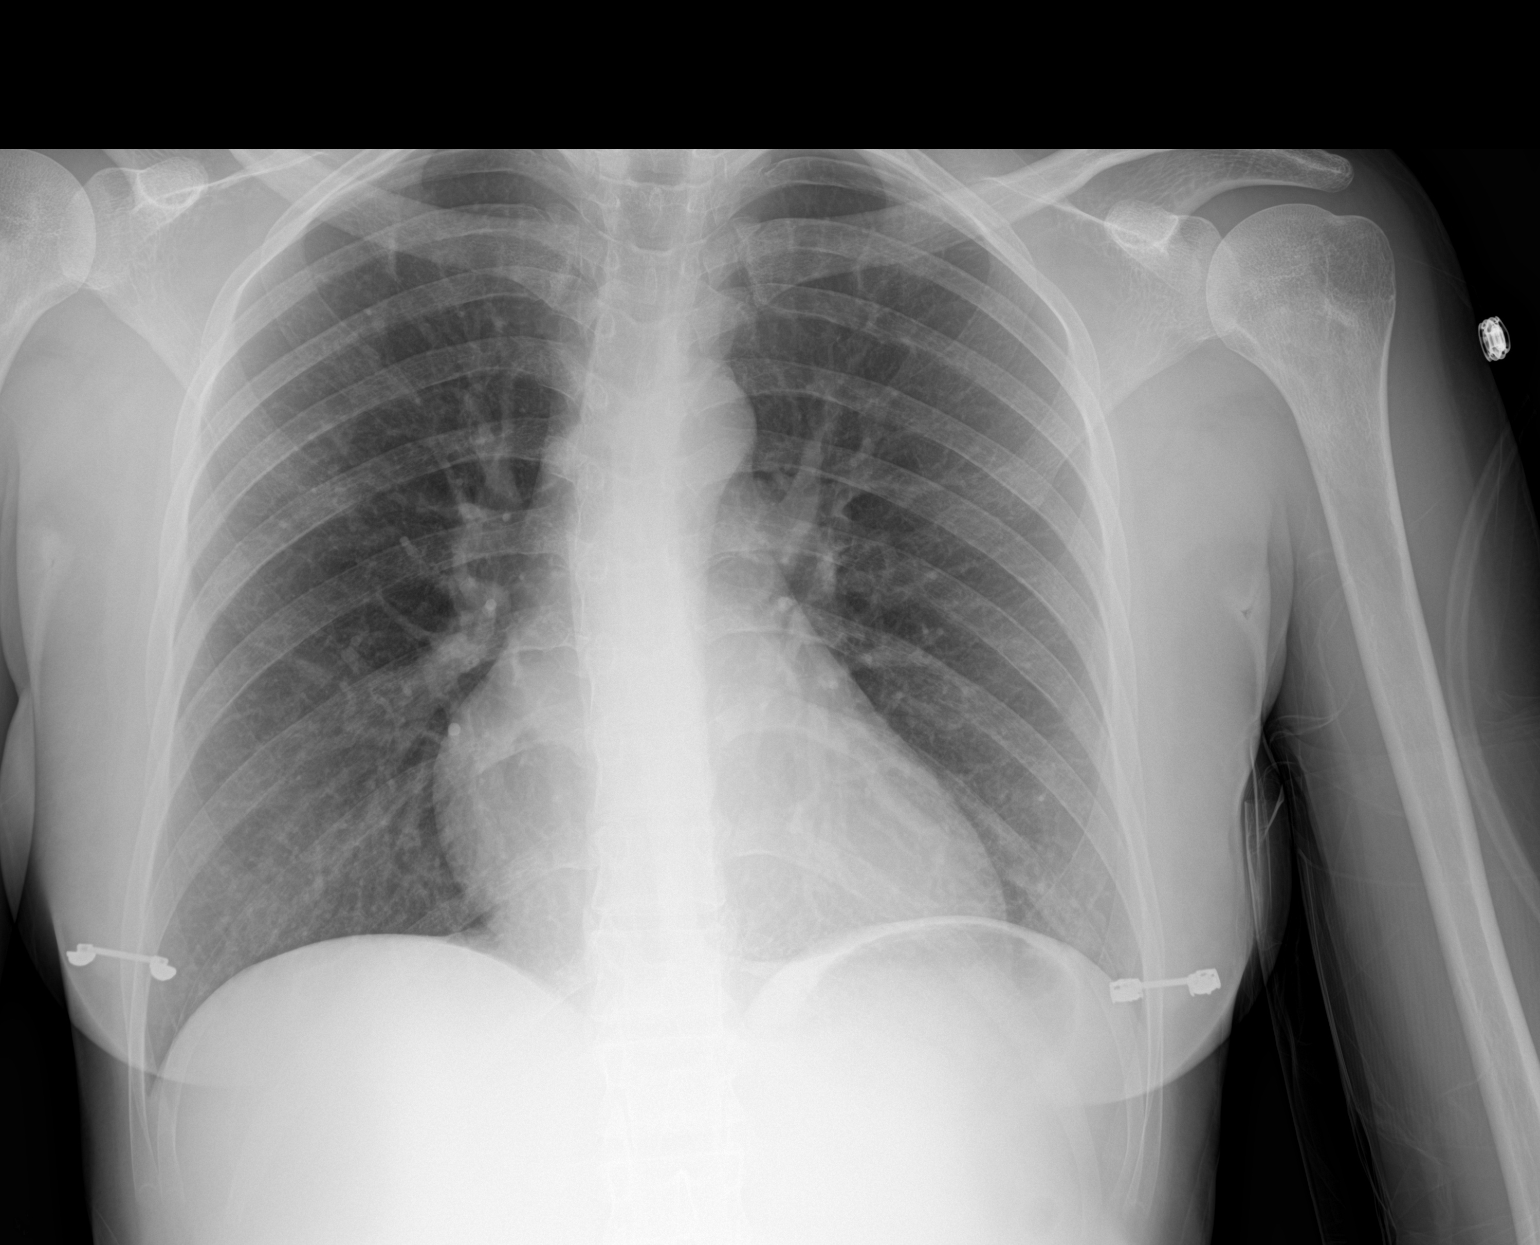

[1 of 1 positions shown; findings below may reference images not displayed]

FINDINGS: The heart size and mediastinal contours are within normal limits.
Both lungs are clear. The visualized skeletal structures are
unremarkable.
IMPRESSION: No active disease.
